# Patient Record
Sex: Male | Born: 1945 | Race: White | Hispanic: No | Marital: Married | Smoking: Never smoker
Health system: Southern US, Community
[De-identification: ages and names within clinical notes are randomized; demographics above are authoritative.]

## PROBLEM LIST (undated history)

## (undated) DIAGNOSIS — K219 Gastro-esophageal reflux disease without esophagitis: Secondary | ICD-10-CM

## (undated) DIAGNOSIS — I10 Essential (primary) hypertension: Secondary | ICD-10-CM

## (undated) DIAGNOSIS — R413 Other amnesia: Secondary | ICD-10-CM

## (undated) DIAGNOSIS — N183 Chronic kidney disease, stage 3 unspecified: Secondary | ICD-10-CM

## (undated) DIAGNOSIS — N529 Male erectile dysfunction, unspecified: Secondary | ICD-10-CM

## (undated) DIAGNOSIS — G4700 Insomnia, unspecified: Secondary | ICD-10-CM

## (undated) HISTORY — PX: UPPER GASTROINTESTINAL ENDOSCOPY: SHX188

## (undated) HISTORY — DX: Other amnesia: R41.3

## (undated) HISTORY — PX: INGUINAL HERNIA REPAIR: SUR1180

## (undated) HISTORY — DX: Essential (primary) hypertension: I10

## (undated) HISTORY — DX: Gastro-esophageal reflux disease without esophagitis: K21.9

## (undated) HISTORY — DX: Male erectile dysfunction, unspecified: N52.9

## (undated) HISTORY — PX: COLONOSCOPY: SHX174

## (undated) HISTORY — DX: Insomnia, unspecified: G47.00

## (undated) HISTORY — DX: Chronic kidney disease, stage 3 unspecified: N18.30

---
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
## 2002-07-14 ENCOUNTER — Ambulatory Visit: Admission: RE | Admit: 2002-07-14 | Discharge: 2002-07-14 | Payer: Self-pay | Admitting: Family Medicine

## 2009-10-13 ENCOUNTER — Encounter: Admission: RE | Admit: 2009-10-13 | Discharge: 2009-10-13 | Payer: Self-pay | Admitting: Internal Medicine

## 2012-06-20 ENCOUNTER — Telehealth: Payer: Self-pay | Admitting: Gastroenterology

## 2012-06-20 NOTE — Telephone Encounter (Signed)
OK for pt to switch, Dr Jarold Motto? Thanks.

## 2012-06-20 NOTE — Telephone Encounter (Signed)
Called pt and he asked that I call him on Monday to schedule his recall Colon.

## 2012-06-20 NOTE — Telephone Encounter (Signed)
Not sure never seen this patient, he can see which ever GI he likes.

## 2012-06-20 NOTE — Telephone Encounter (Signed)
Dr Gessner, will you accept this pt? Thanks. 

## 2012-06-20 NOTE — Telephone Encounter (Signed)
Dr Leone Payor, is a Direct Colon OK? Thanks.

## 2012-06-20 NOTE — Telephone Encounter (Signed)
Yes - as long as not on blood thinners  You should try to get a recent office note from his PCP if possible - looks like it is Dossie Arbour (I think)

## 2012-06-20 NOTE — Telephone Encounter (Signed)
That is fine (he lives around corner from me)

## 2012-06-25 NOTE — Telephone Encounter (Signed)
Left message for patient to call back  

## 2012-06-26 NOTE — Telephone Encounter (Signed)
Unable to reach the patient I have left him a detailed message to call back and schedule his colonoscopy at his convenience.  I have also left a detailed message with [facility] to send Dr. Silvano Rusk last office note

## 2012-06-27 ENCOUNTER — Encounter: Payer: Self-pay | Admitting: Internal Medicine

## 2012-07-25 ENCOUNTER — Ambulatory Visit (AMBULATORY_SURGERY_CENTER): Payer: BC Managed Care – PPO | Admitting: *Deleted

## 2012-07-25 VITALS — Ht 71.0 in | Wt 218.0 lb

## 2012-07-25 DIAGNOSIS — Z1211 Encounter for screening for malignant neoplasm of colon: Secondary | ICD-10-CM

## 2012-07-25 MED ORDER — NA SULFATE-K SULFATE-MG SULF 17.5-3.13-1.6 GM/177ML PO SOLN: ORAL | Status: DC

## 2012-08-08 ENCOUNTER — Encounter: Payer: Self-pay | Admitting: Internal Medicine

## 2012-08-25 ENCOUNTER — Ambulatory Visit (AMBULATORY_SURGERY_CENTER): Payer: BC Managed Care – PPO | Admitting: Internal Medicine

## 2012-08-25 ENCOUNTER — Encounter: Payer: Self-pay | Admitting: *Deleted

## 2012-08-25 ENCOUNTER — Encounter: Payer: Self-pay | Admitting: Internal Medicine

## 2012-08-25 VITALS — BP 128/83 | HR 69 | Temp 97.3°F | Resp 16 | Ht 71.0 in | Wt 218.0 lb

## 2012-08-25 DIAGNOSIS — K648 Other hemorrhoids: Secondary | ICD-10-CM

## 2012-08-25 DIAGNOSIS — Z1211 Encounter for screening for malignant neoplasm of colon: Secondary | ICD-10-CM

## 2012-08-25 DIAGNOSIS — K5730 Diverticulosis of large intestine without perforation or abscess without bleeding: Secondary | ICD-10-CM

## 2012-08-25 MED ORDER — SODIUM CHLORIDE 0.9 % IV SOLN: 500.0000 mL | INTRAVENOUS | Status: DC

## 2012-08-25 NOTE — Op Note (Signed)
[facility] [address] [place], [address]   COLONOSCOPY PROCEDURE REPORT  PATIENT: Alfred Vega, Alfred Vega  MR#: [id number] BIRTHDATE: 07-Dec-1945 , 66  yrs. old GENDER: Male ENDOSCOPIST: Iva Boop, MD, [facility] REFERRED VH:QIONGE Eloise Harman, M.D. PROCEDURE DATE:  08/25/2012 PROCEDURE:   Colonoscopy, diagnostic ASA CLASS:   Class II INDICATIONS:average risk screening. MEDICATIONS: Propofol (Diprivan) 190 mg IV, MAC sedation, administered by CRNA, and These medications were titrated to patient response per physician's verbal order  DESCRIPTION OF PROCEDURE:   After the risks benefits and alternatives of the procedure were thoroughly explained, informed consent was obtained.  A digital rectal exam revealed no abnormalities of the rectum but prostate was smooth and moderately enlarged.   The LB [id number]  endoscope was introduced through the anus and advanced to the cecum, which was identified by both the appendix and ileocecal valve. No adverse events experienced.   The quality of the prep was Suprep excellent  The instrument was then slowly withdrawn as the colon was fully examined.      COLON FINDINGS: Mild diverticulosis was noted in the sigmoid colon. Small internal hemorrhoids were found.   The colon mucosa was otherwise normal inxcluding right colon retroflexion.  Retroflexed views revealed internal hemorrhoids. The time to cecum=2 minutes 51 seconds.  Withdrawal time=8 minutes 05 seconds.  The scope was withdrawn and the procedure completed. COMPLICATIONS: There were no complications.  ENDOSCOPIC IMPRESSION: 1.   Mild diverticulosis was noted in the sigmoid colon 2.   Small internal hemorrhoids 3.   The colon mucosa was otherwise normal - excellent prep  RECOMMENDATIONS: Repeat colonoscopy 10 years.   eSigned:  Iva Boop, MD, [facility] 08/25/2012 12:02 PM   cc: Jarome Matin, MD and The Patient

## 2012-08-25 NOTE — Patient Instructions (Addendum)
There were small internal hemorrhoids and mild diverticulosis of the colon. No polyps! Prep was excellent also.  Next routine colonoscopy 10 years - 2023.  Thank you for choosing me and [facility].  Iva Boop, MD, FACG  YOU HAD AN ENDOSCOPIC PROCEDURE TODAY AT THE [facility]: Refer to the procedure report that was given to you for any specific questions about what was found during the examination.  If the procedure report does not answer your questions, please call your gastroenterologist to clarify.  If you requested that your care partner not be given the details of your procedure findings, then the procedure report has been included in a sealed envelope for you to review at your convenience later.  YOU SHOULD EXPECT: Some feelings of bloating in the abdomen. Passage of more gas than usual.  Walking can help get rid of the air that was put into your GI tract during the procedure and reduce the bloating. If you had a lower endoscopy (such as a colonoscopy or flexible sigmoidoscopy) you may notice spotting of blood in your stool or on the toilet paper. If you underwent a bowel prep for your procedure, then you may not have a normal bowel movement for a few days.  DIET: Your first meal following the procedure should be a light meal and then it is ok to progress to your normal diet.  A half-sandwich or bowl of soup is an example of a good first meal.  Heavy or fried foods are harder to digest and may make you feel nauseous or bloated.  Likewise meals heavy in dairy and vegetables can cause extra gas to form and this can also increase the bloating.  Drink plenty of fluids but you should avoid alcoholic beverages for 24 hours.  ACTIVITY: Your care partner should take you home directly after the procedure.  You should plan to take it easy, moving slowly for the rest of the day.  You can resume normal activity the day after the procedure however you should NOT DRIVE or use  heavy machinery for 24 hours (because of the sedation medicines used during the test).    SYMPTOMS TO REPORT IMMEDIATELY: A gastroenterologist can be reached at any hour.  During normal business hours, 8:30 AM to 5:00 PM Monday through Friday, call [phone].  After hours and on weekends, please call the GI answering service at [phone] who will take a message and have the physician on call contact you.   Following lower endoscopy (colonoscopy or flexible sigmoidoscopy):  Excessive amounts of blood in the stool  Significant tenderness or worsening of abdominal pains  Swelling of the abdomen that is new, acute  Fever of 100F or higher  FOLLOW UP: If any biopsies were taken you will be contacted by phone or by letter within the next 1-3 weeks.  Call your gastroenterologist if you have not heard about the biopsies in 3 weeks.  Our staff will call the home number listed on your records the next business day following your procedure to check on you and address any questions or concerns that you may have at that time regarding the information given to you following your procedure. This is a courtesy call and so if there is no answer at the home number and we have not heard from you through the emergency physician on call, we will assume that you have returned to your regular daily activities without incident.  SIGNATURES/CONFIDENTIALITY: You and/or your care partner have signed  paperwork which will be entered into your electronic medical record.  These signatures attest to the fact that that the information above on your After Visit Summary has been reviewed and is understood.  Full responsibility of the confidentiality of this discharge information lies with you and/or your care-partner.

## 2012-08-25 NOTE — Progress Notes (Signed)
Patient did not experience any of the following events: a burn prior to discharge; a fall within the facility; wrong site/side/patient/procedure/implant event; or a hospital transfer or hospital admission upon discharge from the facility. (G8907) Patient did not have preoperative order for IV antibiotic SSI prophylaxis. (G8918)  

## 2012-08-26 ENCOUNTER — Telehealth: Payer: Self-pay | Admitting: *Deleted

## 2012-08-26 NOTE — Telephone Encounter (Signed)
  Follow up Call-  Call back number 08/25/2012  Post procedure Call Back phone  # [phone]  Permission to leave phone message Yes     Patient questions:  Do you have a fever, pain , or abdominal swelling? no Pain Score  0 *  Have you tolerated food without any problems? yes  Have you been able to return to your normal activities? yes  Do you have any questions about your discharge instructions: Diet   no Medications  no Follow up visit  no  Do you have questions or concerns about your Care? no  Actions: * If pain score is 4 or above: No action needed, pain <4.

## 2015-04-26 ENCOUNTER — Emergency Department (HOSPITAL_COMMUNITY)
Admission: EM | Admit: 2015-04-26 | Discharge: 2015-04-26 | Disposition: A | Discharge status: Home / Self Care | Payer: BLUE CROSS/BLUE SHIELD | Attending: Emergency Medicine | Admitting: Emergency Medicine

## 2015-04-26 ENCOUNTER — Encounter (HOSPITAL_COMMUNITY): Payer: Self-pay | Admitting: Emergency Medicine

## 2015-04-26 DIAGNOSIS — Z7982 Long term (current) use of aspirin: Secondary | ICD-10-CM | POA: Diagnosis not present

## 2015-04-26 DIAGNOSIS — I10 Essential (primary) hypertension: Secondary | ICD-10-CM | POA: Diagnosis not present

## 2015-04-26 DIAGNOSIS — K219 Gastro-esophageal reflux disease without esophagitis: Secondary | ICD-10-CM | POA: Diagnosis not present

## 2015-04-26 DIAGNOSIS — Y998 Other external cause status: Secondary | ICD-10-CM | POA: Insufficient documentation

## 2015-04-26 DIAGNOSIS — S20212A Contusion of left front wall of thorax, initial encounter: Secondary | ICD-10-CM | POA: Diagnosis not present

## 2015-04-26 DIAGNOSIS — M549 Dorsalgia, unspecified: Secondary | ICD-10-CM | POA: Diagnosis not present

## 2015-04-26 DIAGNOSIS — Y92410 Unspecified street and highway as the place of occurrence of the external cause: Secondary | ICD-10-CM | POA: Insufficient documentation

## 2015-04-26 DIAGNOSIS — Y9389 Activity, other specified: Secondary | ICD-10-CM | POA: Diagnosis not present

## 2015-04-26 DIAGNOSIS — Z79899 Other long term (current) drug therapy: Secondary | ICD-10-CM | POA: Diagnosis not present

## 2015-04-26 DIAGNOSIS — S29001A Unspecified injury of muscle and tendon of front wall of thorax, initial encounter: Secondary | ICD-10-CM | POA: Diagnosis present

## 2015-04-26 MED ORDER — CYCLOBENZAPRINE HCL 10 MG PO TABS
5.0000 mg | ORAL_TABLET | Freq: Once | ORAL | Status: AC
  Administered 2015-04-26: 5 mg via ORAL
  Filled 2015-04-26: qty 1

## 2015-04-26 MED ORDER — CYCLOBENZAPRINE HCL 5 MG PO TABS: 5.0000 mg | ORAL_TABLET | Freq: Three times a day (TID) | ORAL | Status: DC | PRN
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
## 2015-04-26 NOTE — Discharge Instructions (Signed)
Chest Contusion A contusion is a deep bruise. Bruises happen when an injury causes bleeding under the skin. Signs of bruising include pain, puffiness (swelling), and discolored skin. The bruise may turn blue, purple, or yellow.  HOME CARE  Put ice on the injured area.  Put ice in a plastic bag.  Place a towel between the skin and the bag.  Leave the ice on for 15-20 minutes at a time, 03-04 times a day for the first 48 hours.  Only take medicine as told by your doctor.  Rest.  Take deep breaths (deep-breathing exercises) as told by your doctor.  Stop smoking if you smoke.  Do not lift objects over 5 pounds (2.3 kilograms) for 3 days or longer if told by your doctor. GET HELP RIGHT AWAY IF:   You have more bruising or puffiness.  You have pain that gets worse.  You have trouble breathing.  You are dizzy, weak, or pass out (faint).  You have blood in your pee (urine) or poop (stool).  You cough up or throw up (vomit) blood.  Your puffiness or pain is not helped with medicines. MAKE SURE YOU:   Understand these instructions.  Will watch your condition.  Will get help right away if you are not doing well or get worse. Document Released: 03/05/2008 Document Revised: 06/11/2012 Document Reviewed: 03/10/2012 [facility] Patient Information 2015 [place], [place]. This information is not intended to replace advice given to you by your health care provider. Make sure you discuss any questions you have with your health care provider.   Mr. Hruska, you were seen here for your back pain, and we have found no evidence of complicated rib fracture or lung injury at this time. We recommend massage and icing, with taking flexeril and vicodin as needed for pain. It may take several days for your symptoms to improve. However, if your symptoms worsen, please seek medical attention. Thanks for letting us take care of you!  Blane Ohara
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
## 2015-08-30 ENCOUNTER — Ambulatory Visit (INDEPENDENT_AMBULATORY_CARE_PROVIDER_SITE_OTHER): Payer: BLUE CROSS/BLUE SHIELD | Admitting: Neurology

## 2015-08-30 ENCOUNTER — Encounter: Payer: Self-pay | Admitting: Neurology

## 2015-08-30 VITALS — BP 138/72 | HR 78 | Resp 20 | Ht 71.0 in | Wt 214.0 lb

## 2015-08-30 DIAGNOSIS — R0902 Hypoxemia: Secondary | ICD-10-CM | POA: Diagnosis not present

## 2015-08-30 NOTE — Progress Notes (Signed)
SLEEP MEDICINE CLINIC   Provider:  Larey Seat, M D  Referring Provider: Leanna Battles, MD Primary Care Physician:  Alfred Lopes, MD  Chief Complaint  Patient presents with  . New Patient (Initial Visit)    pulse ox was performed, concerned about this, rm 11, alone    HPI:  Alfred Vega is a 69 y.o. male , seen here as a referral from Dr. Philip Vega for sleep apnea evaluation,  [name] had been tested by his primary care physician, Dr. Bevelyn Vega, with a overnight pulse oximetry.  This showed a normal variability of pulse data but 25 desaturations per hour of sleep. He spent 30 minutes in desaturation at or below 89% oxygen saturation, the awake oxygen level was 97%. His overnight on room air study at been performed on 07-18-15 and encompassed over 8 hours of data. This would be considered a valid test by recording time. Interesting is that his oxygenation actually dropped in the middle of the night at about 1 AM 3 AM 4 AM but oxygen saturation increased in trend until he woke close to 7 AM. His heartbeat became lower at night and so I do not see symptoms of stress imposed by this mild hypoxemia. His wife has noticed him to snore very rarely infrequently and overall is convinced that her husband "sleeps like a baby ". The patient himself has no symptoms of gasping for air waking this palpitations or diaphoresis, he does not have headaches at night nor in the morning, when he wakes up he feels usually restored and refreshed.  Sleep habits are as follows: The bedroom is cool, quiet and relatively dark. The couple shares the bedroom. Mr. Alfred Vega usually goes to bed at about 10 to 10:30 PM, and falls asleep promptly. He takes a 25 mg Benadryl every night which helps him to sleep. This has been his habit for about 2 years. He usually has only one bathroom break if any at night and Benadryl has helped him to reinitiate sleep after this break.Usually he wakes up between 6.45 and  7 AM spontaneously he does not rely on an alarm. In the morning he feels refreshed and restored he does not feel any hangover effect from Benadryl.  Sleep medical history and family sleep history: No history , his wife has insomnia.   Social history: self employed , used to [employer] , now [employer].  Review of Systems: Out of a complete 14 system review, the patient complains of only the following symptoms, and all other reviewed systems are negative. Memory loss, distractability, insomnia controlled on benadryl.   Epworth score 11 , Fatigue severity score 9  , Phq9-  depression score 15    Social History   Social History  . Marital Status: Married    Spouse Name: N/A  . Number of Children: N/A  . Years of Education: N/A   Occupational History  . Self employed     [employer]   Social History Main Topics  . Smoking status: Never Smoker   . Smokeless tobacco: Never Used  . Alcohol Use: 0.6 oz/week    1 Cans of beer per week     Comment: 2 beers/week  . Drug Use: No  . Sexual Activity: Not on file   Other Topics Concern  . Not on file   Social History Narrative   Drinks 1 cup of coffee every other day.    Family History  Problem Relation Age of Onset  . Colon  cancer Maternal Grandmother 76  . Stomach cancer Neg Hx   . Esophageal cancer Neg Hx   . Rectal cancer Neg Hx   . CAD Mother   . Diabetes Mellitus II Mother   . Heart disease Father   . Heart attack Father   . Lymphoma Father     Past Medical History  Diagnosis Date  . Hypertension   . GERD (gastroesophageal reflux disease)   . ED (erectile dysfunction)   . Insomnia   . Memory loss     Past Surgical History  Procedure Laterality Date  . Appendectomy  1959  . Inguinal hernia repair  1977, 1985    bilateral  . Colonoscopy      Current Outpatient Prescriptions  Medication Sig Dispense Refill  . aspirin 81 MG tablet Take 81 mg by mouth daily.    Marland Kitchen NEXIUM 40 MG capsule Take 40 mg  by mouth daily before breakfast.     . verapamil (CALAN-SR) 120 MG CR tablet      No current facility-administered medications for this visit.    Allergies as of 08/30/2015  . (No Known Allergies)    Vitals: BP 138/72 mmHg  Pulse 78  Resp 20  Ht 5\' 11"  (1.803 m)  Wt 214 lb (97.07 kg)  BMI 29.86 kg/m2 Last Weight:  Wt Readings from Last 1 Encounters:  08/30/15 214 lb (97.07 kg)   [id number] mass index is 29.86 kg/(m^2).     Last Height:   Ht Readings from Last 1 Encounters:  08/30/15 5\' 11"  (1.803 m)    Physical exam:  General: The patient is awake, alert and appears not in acute distress. The patient is well groomed. Head: Normocephalic, atraumatic. Neck is supple. Mallampati 2,  neck circumference: 17. Nasal airflow unrestricited  TMJ not evident . Mild Retrognathia is seen.  Cardiovascular:  Regular rate and rhythm , without  murmurs or carotid bruit, and without distended neck veins. Respiratory: Lungs are clear to auscultation. Skin:  Without evidence of edema, or rash Trunk: BMI is elevated . The patient's posture ; erect   Neurologic exam : The patient is awake and alert, oriented to place and time.   Memory subjective described as intact.  Attention span & concentration ability appears normal.  Speech is fluent,  without dysarthria, dysphonia or aphasia.  Mood and affect are appropriate.  Cranial nerves: Pupils are equal and briskly reactive to light. Extraocular movements  in vertical and horizontal planes intact and without nystagmus. Visual fields by finger perimetry are intact. Hearing to finger rub intact. Facial sensation intact to fine touch. Facial motor strength is symmetric and tongue and uvula move midline. Shoulder shrug was symmetrical.   Motor exam: Normal tone, muscle bulk and symmetric strength in all extremities.  Sensory:  Fine touch, pinprick and vibration were tested in all extremities.  Proprioception tested in the upper extremities was  normal.  Coordination: Rapid alternating movements in the fingers/hands was normal. Finger-to-nose maneuver  normal without evidence of ataxia, dysmetria or tremor.  Gait and station: Patient walks without assistive device and is able unassisted to climb up to the exam table. Strength within normal limits.  Stance is stable and normal.  Tandem gait is unfragmented. Turns with 3 Steps. Romberg testing is negative.  Deep tendon reflexes: in the  upper and lower extremities are symmetric and intact. Babinski maneuver response is  downgoing.  The patient was advised of the nature of the diagnosed sleep disorder , the treatment options  and risks for general a health and wellness arising from not treating the condition.  I spent more than 30 minutes of face to face time with the patient. Greater than 50% of time was spent in counseling and coordination of care. We have discussed the diagnosis and differential and I answered the patient's questions.     Assessment:  After physical and neurologic examination, review of laboratory studies,  Personal review of imaging studies, reports of other /same  Imaging studies ,  Results of polysomnography/ neurophysiology testing and pre-existing records as far as provided in visit., my assessment is   1) my main consideration for this visit today was not to diagnose the patient with ADHD, but by history he has some very likely symptoms that can be only attributed to a residual form of ADD or ADHD. Mr. Weideman is still full time gainfully self-employed he has the ability to make his own schedules and I do not see a need for him to be medicated less he would feel bothered by the symptoms.  2) the patients pulse oximetry showed only 30 minutes which is the cutoff line for hypoxemia being diagnosed in a study that lasted well over 8 hours. Looking at the graphic went out I would think that these aren't correlated with REM sleep phases after midnight and that the hypoxemia  would be physiologically expected. He did not have tachycardia or bradycardia arrhythmias. And actually his heartbeat slowed as the night progressed which is the opposite of a patient that has hypoxemic stress.  3) there are no but witnessed apneas or snoring and the patient has a history of insomnia which has been well-controlled with a small dose of Benadryl in his case 25 mg. Benadryl as an antihistamine it does not interfere with his memory and daytime he does not wake up feeling drowsy or groggy. Based on this I would allow him to continue this regimen should he ever feel a hangover effect I would ask him to take the Benadryl little earlier for that his body has some time to get rid of the metabolites. As to a sleep study in a formal sleep study I think there is barely enough data to justify this.   Plan:  Treatment plan and additional workup :  Revisit when necessary. The patient denies the irresistible urge to sleep, and increased degree of fatigue, and his wife has neither noted snoring nor apneas. He wakes up refreshed and restored.        Asencion Partridge Mayme Profeta MD  08/30/2015   CC: Alfred Vega, [address] [place], [place] [id number]

## 2019-08-13 ENCOUNTER — Other Ambulatory Visit: Payer: Self-pay | Admitting: Internal Medicine

## 2019-08-13 DIAGNOSIS — N183 Chronic kidney disease, stage 3 unspecified: Secondary | ICD-10-CM

## 2019-08-17 ENCOUNTER — Encounter: Payer: Self-pay | Admitting: Physician Assistant

## 2019-08-20 ENCOUNTER — Other Ambulatory Visit: Payer: BLUE CROSS/BLUE SHIELD

## 2019-09-03 ENCOUNTER — Ambulatory Visit
Admission: RE | Admit: 2019-09-03 | Discharge: 2019-09-03 | Disposition: A | Discharge status: Home / Self Care | Payer: Commercial Managed Care - PPO | Source: Ambulatory Visit | Attending: Internal Medicine | Admitting: Internal Medicine

## 2019-09-03 ENCOUNTER — Encounter: Payer: Self-pay | Admitting: Physician Assistant

## 2019-09-03 ENCOUNTER — Ambulatory Visit (INDEPENDENT_AMBULATORY_CARE_PROVIDER_SITE_OTHER): Payer: Commercial Managed Care - PPO | Admitting: Physician Assistant

## 2019-09-03 VITALS — BP 122/70 | HR 73 | Temp 97.6°F | Ht 71.0 in | Wt 217.4 lb

## 2019-09-03 DIAGNOSIS — R195 Other fecal abnormalities: Secondary | ICD-10-CM | POA: Diagnosis not present

## 2019-09-03 DIAGNOSIS — K219 Gastro-esophageal reflux disease without esophagitis: Secondary | ICD-10-CM | POA: Diagnosis not present

## 2019-09-03 DIAGNOSIS — N183 Chronic kidney disease, stage 3 unspecified: Secondary | ICD-10-CM

## 2019-09-03 DIAGNOSIS — Z1159 Encounter for screening for other viral diseases: Secondary | ICD-10-CM | POA: Diagnosis not present

## 2019-09-03 MED ORDER — NA SULFATE-K SULFATE-MG SULF 17.5-3.13-1.6 GM/177ML PO SOLN: 1.0000 | Freq: Once | ORAL | 0 refills | Status: AC

## 2019-09-03 NOTE — Patient Instructions (Signed)
You have been scheduled for an endoscopy and colonoscopy. Please follow the written instructions given to you at your visit today. Please pick up your prep supplies at the pharmacy within the next 1-3 days. If you use inhalers (even only as needed), please bring them with you on the day of your procedure.  

## 2019-09-03 NOTE — Progress Notes (Signed)
Subjective:    Patient ID: Alfred Vega, male    DOB: 12-25-1945, 73 y.o.   MRN: [id number]  HPI Alfred Vega is a pleasant 73 year old white male, known to Dr. Carlean Purl, referred today by Dr. Bevelyn Buckles for Hemoccult positive stool. Patient was last seen in our office in 2013 and underwent colonoscopy at that time which showed mild sigmoid diverticulosis, small internal hemorrhoids and otherwise negative exam. He does have history of hypertension and chronic kidney disease. He is not on any blood thinners. Patient currently has no complaints of melena or hematochezia, no changes in bowel habits, no complaints of abdominal pain or discomfort.  Specifically denies any heartburn or indigestion no dysphagia or odynophagia.  He does have long history of chronic GERD and is on Nexium 40 mg p.o. daily which he says works well.  He believes he has been on prescription medication for reflux for at least 10 years.  He has not had prior EGD.  Recent labs through Dr. Beverly Sessions office reviewed with c-Met unremarkable, WBC 4.3, hemoglobin 15.5, platelets 127.  Review of Systems Pertinent positive and negative review of systems were noted in the above HPI section.  All other review of systems was otherwise negative.  Outpatient Encounter Medications as of 09/03/2019  Medication Sig  . aspirin 81 MG tablet Take 81 mg by mouth daily.  Marland Kitchen NEXIUM 40 MG capsule Take 40 mg by mouth daily before breakfast.   . verapamil (CALAN-SR) 120 MG CR tablet   . Na Sulfate-K Sulfate-Mg Sulf 17.5-3.13-1.6 GM/177ML SOLN Take 1 kit by mouth once for 1 dose.   No facility-administered encounter medications on file as of 09/03/2019.    No Known Allergies Patient Active Problem List   Diagnosis Date Noted  . Hypoxemia 08/30/2015   Social History   Socioeconomic History  . Marital status: Married    Spouse name: Not on file  . Number of children: Not on file  . Years of education: Not on file  . Highest  education level: Not on file  Occupational History  . Occupation: Self employed    Comment: [employer]  Social Needs  . Financial resource strain: Not on file  . Food insecurity    Worry: Not on file    Inability: Not on file  . Transportation needs    Medical: Not on file    Non-medical: Not on file  Tobacco Use  . Smoking status: Never Smoker  . Smokeless tobacco: Never Used  Substance and Sexual Activity  . Alcohol use: Yes    Alcohol/week: 1.0 standard drinks    Types: 1 Cans of beer per week    Comment: 2 beers/week  . Drug use: No  . Sexual activity: Not on file  Lifestyle  . Physical activity    Days per week: Not on file    Minutes per session: Not on file  . Stress: Not on file  Relationships  . Social Herbalist on phone: Not on file    Gets together: Not on file    Attends religious service: Not on file    Active member of club or organization: Not on file    Attends meetings of clubs or organizations: Not on file    Relationship status: Not on file  . Intimate partner violence    Fear of current or ex partner: Not on file    Emotionally abused: Not on file    Physically abused: Not on file  Forced sexual activity: Not on file  Other Topics Concern  . Not on file  Social History Narrative   Drinks 1 cup of coffee every other day.    Alfred Vega family history includes CAD in his mother; Colon cancer (age of onset: 23) in his maternal grandmother; Diabetes Mellitus II in his mother; Heart attack in his father; Heart disease in his father; Lymphoma in his father.      Objective:    Vitals:   09/03/19 0910  BP: 122/70  Pulse: 73  Temp: 97.6 F (36.4 C)    Physical Exam Well-developed well-nourished older white male in no acute distress.  Height, Weight, 217 BMI 30.3  HEENT; nontraumatic normocephalic, EOMI, PER R LA, sclera anicteric. Oropharynx; not examined/mask/Covid Neck; supple, no JVD Cardiovascular; regular rate and rhythm  with S1-S2, no murmur rub or gallop Pulmonary; Clear bilaterally Abdomen; soft, nontender, nondistended, no palpable mass or hepatosplenomegaly, bowel sounds are active Rectal; not done-recently documented Hemoccult positive Skin; benign exam, no jaundice rash or appreciable lesions Extremities; no clubbing cyanosis or edema skin warm and dry Neuro/Psych; alert and oriented x4, grossly nonfocal mood and affect appropriate       Assessment & Plan:   #28 73 year old white male with heme positive stool, asymptomatic, with normal hemoglobin. Rule out occult colon lesion. #2 chronic GERD, requiring prescription therapy for several years.  Rule out Barrett's esophagus #3 diverticulosis 4.  Previously documented internal hemorrhoids 5.  Hypertension 6.  Chronic kidney disease  Plan; patient will be scheduled for Colonoscopy and upper endoscopy with Dr. Carlean Purl.  Both procedures were discussed in detail with the patient including indications risks and benefits and he is agreeable to proceed. Continue Nexium 40 mg p.o. every morning Further recommendations pending findings at above.  Kailiana Granquist Genia Harold PA-C 09/03/2019   Cc: Leanna Battles, MD

## 2019-09-17 ENCOUNTER — Encounter: Payer: Self-pay | Admitting: Internal Medicine

## 2019-09-18 ENCOUNTER — Other Ambulatory Visit: Payer: Self-pay | Admitting: Internal Medicine

## 2019-09-18 ENCOUNTER — Ambulatory Visit (INDEPENDENT_AMBULATORY_CARE_PROVIDER_SITE_OTHER): Payer: Commercial Managed Care - PPO

## 2019-09-18 DIAGNOSIS — Z1159 Encounter for screening for other viral diseases: Secondary | ICD-10-CM

## 2019-09-18 LAB — SARS CORONAVIRUS 2 (TAT 6-24 HRS): SARS Coronavirus 2: NEGATIVE

## 2019-09-21 ENCOUNTER — Encounter: Payer: Self-pay | Admitting: Internal Medicine

## 2019-09-21 ENCOUNTER — Ambulatory Visit (AMBULATORY_SURGERY_CENTER): Payer: Commercial Managed Care - PPO | Admitting: Internal Medicine

## 2019-09-21 ENCOUNTER — Other Ambulatory Visit: Payer: Self-pay

## 2019-09-21 VITALS — BP 137/81 | HR 67 | Temp 97.9°F | Resp 14 | Ht 71.0 in | Wt 217.0 lb

## 2019-09-21 DIAGNOSIS — D122 Benign neoplasm of ascending colon: Secondary | ICD-10-CM

## 2019-09-21 DIAGNOSIS — K449 Diaphragmatic hernia without obstruction or gangrene: Secondary | ICD-10-CM

## 2019-09-21 DIAGNOSIS — D125 Benign neoplasm of sigmoid colon: Secondary | ICD-10-CM

## 2019-09-21 DIAGNOSIS — K5730 Diverticulosis of large intestine without perforation or abscess without bleeding: Secondary | ICD-10-CM

## 2019-09-21 DIAGNOSIS — K317 Polyp of stomach and duodenum: Secondary | ICD-10-CM

## 2019-09-21 DIAGNOSIS — K219 Gastro-esophageal reflux disease without esophagitis: Secondary | ICD-10-CM | POA: Diagnosis not present

## 2019-09-21 DIAGNOSIS — R195 Other fecal abnormalities: Secondary | ICD-10-CM

## 2019-09-21 MED ORDER — SODIUM CHLORIDE 0.9 % IV SOLN
500.0000 mL | Freq: Once | INTRAVENOUS | Status: DC
Start: 2019-09-21 — End: 2019-09-21

## 2019-09-21 NOTE — Progress Notes (Signed)
A/ox3, pleased with MAC, report to RN 

## 2019-09-21 NOTE — Patient Instructions (Addendum)
I found and removed 5 colon polyps - all tiny and benign-appearing.  You also have some stomach polyps that look benign and innocent and are likely related to taking nexium.  Once I see the pathology results I will explain more but I do not think anything bad going on.  I appreciate the opportunity to care for you. Gatha Mayer, MD, [facility]  Handouts given on polyps and diverticulosis.  YOU HAD AN ENDOSCOPIC PROCEDURE TODAY AT [facility]:   Refer to the procedure report that was given to you for any specific questions about what was found during the examination.  If the procedure report does not answer your questions, please call your gastroenterologist to clarify.  If you requested that your care partner not be given the details of your procedure findings, then the procedure report has been included in a sealed envelope for you to review at your convenience later.  YOU SHOULD EXPECT: Some feelings of bloating in the abdomen. Passage of more gas than usual.  Walking can help get rid of the air that was put into your GI tract during the procedure and reduce the bloating. If you had a lower endoscopy (such as a colonoscopy or flexible sigmoidoscopy) you may notice spotting of blood in your stool or on the toilet paper. If you underwent a bowel prep for your procedure, you may not have a normal bowel movement for a few days.  Please Note:  You might notice some irritation and congestion in your nose or some drainage.  This is from the oxygen used during your procedure.  There is no need for concern and it should clear up in a day or so.  SYMPTOMS TO REPORT IMMEDIATELY:   Following lower endoscopy (colonoscopy or flexible sigmoidoscopy):  Excessive amounts of blood in the stool  Significant tenderness or worsening of abdominal pains  Swelling of the abdomen that is new, acute  Fever of 100F or higher   Following upper endoscopy (EGD)  Vomiting of blood or coffee ground  material  New chest pain or pain under the shoulder blades  Painful or persistently difficult swallowing  New shortness of breath  Fever of 100F or higher  Black, tarry-looking stools  For urgent or emergent issues, a gastroenterologist can be reached at any hour by calling [phone].   DIET:  We do recommend a small meal at first, but then you may proceed to your regular diet.  Drink plenty of fluids but you should avoid alcoholic beverages for 24 hours.  ACTIVITY:  You should plan to take it easy for the rest of today and you should NOT DRIVE or use heavy machinery until tomorrow (because of the sedation medicines used during the test).    FOLLOW UP: Our staff will call the number listed on your records 48-72 hours following your procedure to check on you and address any questions or concerns that you may have regarding the information given to you following your procedure. If we do not reach you, we will leave a message.  We will attempt to reach you two times.  During this call, we will ask if you have developed any symptoms of COVID 19. If you develop any symptoms (ie: fever, flu-like symptoms, shortness of breath, cough etc.) before then, please call [phone].  If you test positive for Covid 19 in the 2 weeks post procedure, please call and report this information to [place].    If any biopsies were taken you will  be contacted by phone or by letter within the next 1-3 weeks.  Please call us at [phone] if you have not heard about the biopsies in 3 weeks.    SIGNATURES/CONFIDENTIALITY: You and/or your care partner have signed paperwork which will be entered into your electronic medical record.  These signatures attest to the fact that that the information above on your After Visit Summary has been reviewed and is understood.  Full responsibility of the confidentiality of this discharge information lies with you and/or your care-partner.

## 2019-09-21 NOTE — Op Note (Signed)
[facility] Patient Name: Alfred Vega Procedure Date: 09/21/2019 3:54 PM MRN: [id number] Endoscopist: Gatha Mayer , MD Age: 73 Referring MD:  Date of Birth: 26-Dec-1945 Gender: Male Account #: [id number] Procedure:                Upper GI endoscopy Indications:              Esophageal reflux, Exclusion of Barrett's esophagus Medicines:                Propofol per Anesthesia, Monitored Anesthesia Care Procedure:                Pre-Anesthesia Assessment:                           - Prior to the procedure, a History and Physical                            was performed, and patient medications and                            allergies were reviewed. The patient's tolerance of                            previous anesthesia was also reviewed. The risks                            and benefits of the procedure and the sedation                            options and risks were discussed with the patient.                            All questions were answered, and informed consent                            was obtained. Prior Anticoagulants: The patient has                            taken no previous anticoagulant or antiplatelet                            agents. ASA Grade Assessment: II - A patient with                            mild systemic disease. After reviewing the risks                            and benefits, the patient was deemed in                            satisfactory condition to undergo the procedure.                           After obtaining informed consent, the endoscope was  passed under direct vision. Throughout the                            procedure, the patient's blood pressure, pulse, and                            oxygen saturations were monitored continuously. The                            Endoscope was introduced through the mouth, and                            advanced to the second part of duodenum. The upper                     GI endoscopy was accomplished without difficulty.                            The patient tolerated the procedure well. Scope In: Scope Out: Findings:                 The examined esophagus was normal.                           Multiple diminutive semi-sessile polyps were found                            in the gastric fundus and in the gastric body.                            Biopsies were taken with a cold forceps for                            histology. Verification of patient identification                            for the specimen was done. Estimated blood loss was                            minimal.                           Patchy mildly erythematous mucosa was found in the                            entire examined stomach.                           The cardia and gastric fundus were normal on                            retroflexion.                           A small hiatal hernia was present.  The examined duodenum was normal. Complications:            No immediate complications. Estimated Blood Loss:     Estimated blood loss was minimal. Impression:               - Normal esophagus.                           - Multiple gastric polyps. Biopsied.                           - Erythematous mucosa in the stomach.                           - Small hiatal hernia.                           - Normal examined duodenum. Recommendation:           - Patient has a contact number available for                            emergencies. The signs and symptoms of potential                            delayed complications were discussed with the                            patient. Return to normal activities tomorrow.                            Written discharge instructions were provided to the                            patient.                           - Resume previous diet.                           - Continue present medications.                            - Await pathology results.                           - See the other procedure note for documentation of                            additional recommendations. Gatha Mayer, MD 09/21/2019 4:43:10 PM This report has been signed electronically.

## 2019-09-21 NOTE — Progress Notes (Signed)
Called to room to assist during endoscopic procedure.  Patient ID and intended procedure confirmed with present staff. Received instructions for my participation in the procedure from the performing physician.  

## 2019-09-21 NOTE — Progress Notes (Signed)
Temp LC  VS DT  Pt's states no medical or surgical changes since previsit or office visit.  Admitting RN reviewed. 

## 2019-09-21 NOTE — Op Note (Signed)
[facility] Patient Name: Alfred Vega Procedure Date: 09/21/2019 3:53 PM MRN: [id number] Endoscopist: Gatha Mayer , MD Age: 73 Referring MD:  Date of Birth: 06/18/1946 Gender: Male Account #: [id number] Procedure:                Colonoscopy Indications:              Positive fecal immunochemical test Medicines:                Propofol per Anesthesia, Monitored Anesthesia Care Procedure:                Pre-Anesthesia Assessment:                           - Prior to the procedure, a History and Physical                            was performed, and patient medications and                            allergies were reviewed. The patient's tolerance of                            previous anesthesia was also reviewed. The risks                            and benefits of the procedure and the sedation                            options and risks were discussed with the patient.                            All questions were answered, and informed consent                            was obtained. Prior Anticoagulants: The patient has                            taken no previous anticoagulant or antiplatelet                            agents. ASA Grade Assessment: II - A patient with                            mild systemic disease. After reviewing the risks                            and benefits, the patient was deemed in                            satisfactory condition to undergo the procedure.                           After obtaining informed consent, the colonoscope  was passed under direct vision. Throughout the                            procedure, the patient's blood pressure, pulse, and                            oxygen saturations were monitored continuously. The                            Colonoscope was introduced through the anus and                            advanced to the the cecum, identified by                            appendiceal  orifice and ileocecal valve. The                            colonoscopy was performed without difficulty. The                            patient tolerated the procedure well. The quality                            of the bowel preparation was good. The bowel                            preparation used was Miralax via split dose                            instruction. The ileocecal valve, appendiceal                            orifice, and rectum were photographed. Scope In: 4:10:21 PM Scope Out: 4:31:35 PM Scope Withdrawal Time: 0 hours 19 minutes 24 seconds  Total Procedure Duration: 0 hours 21 minutes 14 seconds  Findings:                 The perianal and digital rectal examinations were                            normal. Pertinent negatives include normal prostate                            (size, shape, and consistency).                           Three sessile polyps were found in the sigmoid                            colon and ascending colon. The polyps were                            diminutive in size. These polyps were removed with  a cold snare. Resection and retrieval were                            complete. Verification of patient identification                            for the specimen was done. Estimated blood loss was                            minimal.                           Two sessile polyps were found in the ascending                            colon. The polyps were diminutive in size. These                            polyps were removed with a cold biopsy forceps.                            Resection and retrieval were complete. Verification                            of patient identification for the specimen was                            done. Estimated blood loss was minimal.                           Multiple diverticula were found in the sigmoid                            colon.                           The exam was otherwise  without abnormality on                            direct and retroflexion views. Complications:            No immediate complications. Estimated Blood Loss:     Estimated blood loss was minimal. Impression:               - Three diminutive polyps in the sigmoid colon and                            in the ascending colon, removed with a cold snare.                            Resected and retrieved.                           - Two diminutive polyps in the ascending colon,  removed with a cold biopsy forceps. Resected and                            retrieved.                           - Diverticulosis in the sigmoid colon.                           - The examination was otherwise normal on direct                            and retroflexion views. Recommendation:           - Patient has a contact number available for                            emergencies. The signs and symptoms of potential                            delayed complications were discussed with the                            patient. Return to normal activities tomorrow.                            Written discharge instructions were provided to the                            patient.                           - Resume previous diet.                           - Continue present medications.                           - Repeat colonoscopy is recommended. The                            colonoscopy date will be determined after pathology                            results from today's exam become available for                            review. Gatha Mayer, MD 09/21/2019 4:47:01 PM This report has been signed electronically.

## 2019-09-23 ENCOUNTER — Telehealth: Payer: Self-pay

## 2019-09-23 NOTE — Telephone Encounter (Signed)
  Follow up Call-  Call back number 09/21/2019  Post procedure Call Back phone  # [phone]  Permission to leave phone message Yes  Some recent data might be hidden     Patient questions:  Do you have a fever, pain , or abdominal swelling? No. Pain Score  0 *  Have you tolerated food without any problems? Yes.    Have you been able to return to your normal activities? Yes.    Do you have any questions about your discharge instructions: Diet   No. Medications  No. Follow up visit  No.  Do you have questions or concerns about your Care? No.  Actions: * If pain score is 4 or above: No action needed, pain <4.  1. Have you developed a fever since your procedure? no  2.   Have you had an respiratory symptoms (SOB or cough) since your procedure? no  3.   Have you tested positive for COVID 19 since your procedure no  4.   Have you had any family members/close contacts diagnosed with the COVID 19 since your procedure?  no   If yes to any of these questions please route to Joylene John, RN and Alphonsa Gin, Therapist, sports.

## 2019-10-01 ENCOUNTER — Encounter: Payer: Self-pay | Admitting: Internal Medicine

## 2019-10-01 DIAGNOSIS — K317 Polyp of stomach and duodenum: Secondary | ICD-10-CM | POA: Insufficient documentation

## 2019-10-01 DIAGNOSIS — Z860100 Personal history of colon polyps, unspecified: Secondary | ICD-10-CM | POA: Insufficient documentation

## 2019-10-01 HISTORY — DX: Personal history of colon polyps, unspecified: Z86.0100

## 2019-10-01 HISTORY — DX: Personal history of colonic polyps: Z86.010

## 2019-10-01 HISTORY — DX: Polyp of stomach and duodenum: K31.7

## 2019-10-06 ENCOUNTER — Other Ambulatory Visit: Payer: Self-pay | Admitting: *Deleted

## 2019-10-31 ENCOUNTER — Ambulatory Visit: Payer: Commercial Managed Care - PPO

## 2019-11-05 ENCOUNTER — Ambulatory Visit: Payer: Commercial Managed Care - PPO

## 2019-11-08 ENCOUNTER — Ambulatory Visit: Payer: Commercial Managed Care - PPO

## 2019-11-11 ENCOUNTER — Ambulatory Visit: Payer: Commercial Managed Care - PPO

## 2020-06-01 DIAGNOSIS — H903 Sensorineural hearing loss, bilateral: Secondary | ICD-10-CM | POA: Diagnosis not present

## 2020-06-01 DIAGNOSIS — H9313 Tinnitus, bilateral: Secondary | ICD-10-CM | POA: Diagnosis not present

## 2020-08-02 DIAGNOSIS — X32XXXD Exposure to sunlight, subsequent encounter: Secondary | ICD-10-CM | POA: Diagnosis not present

## 2020-08-02 DIAGNOSIS — L720 Epidermal cyst: Secondary | ICD-10-CM | POA: Diagnosis not present

## 2020-08-02 DIAGNOSIS — L570 Actinic keratosis: Secondary | ICD-10-CM | POA: Diagnosis not present

## 2020-08-05 DIAGNOSIS — I10 Essential (primary) hypertension: Secondary | ICD-10-CM | POA: Diagnosis not present

## 2020-08-05 DIAGNOSIS — Z125 Encounter for screening for malignant neoplasm of prostate: Secondary | ICD-10-CM | POA: Diagnosis not present

## 2020-08-12 DIAGNOSIS — I129 Hypertensive chronic kidney disease with stage 1 through stage 4 chronic kidney disease, or unspecified chronic kidney disease: Secondary | ICD-10-CM | POA: Diagnosis not present

## 2020-08-12 DIAGNOSIS — Z0000 Encounter for general adult medical examination without abnormal findings: Secondary | ICD-10-CM | POA: Diagnosis not present

## 2020-08-12 DIAGNOSIS — R490 Dysphonia: Secondary | ICD-10-CM | POA: Diagnosis not present

## 2020-08-12 DIAGNOSIS — M25561 Pain in right knee: Secondary | ICD-10-CM | POA: Diagnosis not present

## 2020-08-12 DIAGNOSIS — G4700 Insomnia, unspecified: Secondary | ICD-10-CM | POA: Diagnosis not present

## 2020-08-12 DIAGNOSIS — K219 Gastro-esophageal reflux disease without esophagitis: Secondary | ICD-10-CM | POA: Diagnosis not present

## 2020-08-12 DIAGNOSIS — I10 Essential (primary) hypertension: Secondary | ICD-10-CM | POA: Diagnosis not present

## 2020-08-12 DIAGNOSIS — R6889 Other general symptoms and signs: Secondary | ICD-10-CM | POA: Diagnosis not present

## 2020-08-12 DIAGNOSIS — H9190 Unspecified hearing loss, unspecified ear: Secondary | ICD-10-CM | POA: Diagnosis not present

## 2020-08-29 DIAGNOSIS — L570 Actinic keratosis: Secondary | ICD-10-CM | POA: Diagnosis not present

## 2020-08-29 DIAGNOSIS — L720 Epidermal cyst: Secondary | ICD-10-CM | POA: Diagnosis not present

## 2020-08-29 DIAGNOSIS — X32XXXD Exposure to sunlight, subsequent encounter: Secondary | ICD-10-CM | POA: Diagnosis not present

## 2020-09-07 DIAGNOSIS — Z1212 Encounter for screening for malignant neoplasm of rectum: Secondary | ICD-10-CM | POA: Diagnosis not present

## 2020-09-15 DIAGNOSIS — H0102B Squamous blepharitis left eye, upper and lower eyelids: Secondary | ICD-10-CM | POA: Diagnosis not present

## 2020-09-15 DIAGNOSIS — H0102A Squamous blepharitis right eye, upper and lower eyelids: Secondary | ICD-10-CM | POA: Diagnosis not present

## 2020-09-15 DIAGNOSIS — H40013 Open angle with borderline findings, low risk, bilateral: Secondary | ICD-10-CM | POA: Diagnosis not present

## 2020-09-15 DIAGNOSIS — H2513 Age-related nuclear cataract, bilateral: Secondary | ICD-10-CM | POA: Diagnosis not present

## 2020-09-15 DIAGNOSIS — H31092 Other chorioretinal scars, left eye: Secondary | ICD-10-CM | POA: Diagnosis not present

## 2020-09-15 DIAGNOSIS — H10413 Chronic giant papillary conjunctivitis, bilateral: Secondary | ICD-10-CM | POA: Diagnosis not present

## 2020-10-10 DIAGNOSIS — K219 Gastro-esophageal reflux disease without esophagitis: Secondary | ICD-10-CM | POA: Diagnosis not present

## 2020-10-14 DIAGNOSIS — I10 Essential (primary) hypertension: Secondary | ICD-10-CM | POA: Diagnosis not present

## 2021-02-07 DIAGNOSIS — L570 Actinic keratosis: Secondary | ICD-10-CM | POA: Diagnosis not present

## 2021-02-07 DIAGNOSIS — X32XXXD Exposure to sunlight, subsequent encounter: Secondary | ICD-10-CM | POA: Diagnosis not present

## 2021-02-07 DIAGNOSIS — L820 Inflamed seborrheic keratosis: Secondary | ICD-10-CM | POA: Diagnosis not present

## 2021-02-28 ENCOUNTER — Other Ambulatory Visit: Payer: Commercial Managed Care - PPO

## 2021-03-15 DIAGNOSIS — C44629 Squamous cell carcinoma of skin of left upper limb, including shoulder: Secondary | ICD-10-CM | POA: Diagnosis not present

## 2021-07-29 DIAGNOSIS — Z23 Encounter for immunization: Secondary | ICD-10-CM | POA: Diagnosis not present

## 2021-08-08 DIAGNOSIS — Z85828 Personal history of other malignant neoplasm of skin: Secondary | ICD-10-CM | POA: Diagnosis not present

## 2021-08-08 DIAGNOSIS — Z08 Encounter for follow-up examination after completed treatment for malignant neoplasm: Secondary | ICD-10-CM | POA: Diagnosis not present

## 2021-08-08 DIAGNOSIS — X32XXXD Exposure to sunlight, subsequent encounter: Secondary | ICD-10-CM | POA: Diagnosis not present

## 2021-08-08 DIAGNOSIS — L570 Actinic keratosis: Secondary | ICD-10-CM | POA: Diagnosis not present

## 2021-09-04 DIAGNOSIS — I10 Essential (primary) hypertension: Secondary | ICD-10-CM | POA: Diagnosis not present

## 2021-09-04 DIAGNOSIS — Z125 Encounter for screening for malignant neoplasm of prostate: Secondary | ICD-10-CM | POA: Diagnosis not present

## 2021-09-11 DIAGNOSIS — R490 Dysphonia: Secondary | ICD-10-CM | POA: Diagnosis not present

## 2021-09-11 DIAGNOSIS — Z0000 Encounter for general adult medical examination without abnormal findings: Secondary | ICD-10-CM | POA: Diagnosis not present

## 2021-09-11 DIAGNOSIS — I10 Essential (primary) hypertension: Secondary | ICD-10-CM | POA: Diagnosis not present

## 2021-09-11 DIAGNOSIS — Z1212 Encounter for screening for malignant neoplasm of rectum: Secondary | ICD-10-CM | POA: Diagnosis not present

## 2021-09-11 DIAGNOSIS — R82998 Other abnormal findings in urine: Secondary | ICD-10-CM | POA: Diagnosis not present

## 2021-09-11 DIAGNOSIS — F988 Other specified behavioral and emotional disorders with onset usually occurring in childhood and adolescence: Secondary | ICD-10-CM | POA: Diagnosis not present

## 2021-09-11 DIAGNOSIS — N529 Male erectile dysfunction, unspecified: Secondary | ICD-10-CM | POA: Diagnosis not present

## 2021-09-11 DIAGNOSIS — R972 Elevated prostate specific antigen [PSA]: Secondary | ICD-10-CM | POA: Diagnosis not present

## 2021-09-18 IMAGING — US US RENAL
1 series · 14 of 25 positions shown · non-contrast
Comparison: Abdominal MRI dated 10/13/2009.

CLINICAL DATA: 73-year-old male with chronic kidney disease.
Evaluate for obstruction.

EXAM:
RENAL / URINARY TRACT ULTRASOUND COMPLETE

[Series 1: us renal · 0.25mm/px · 14 of 52 slices shown]
[im 1/52]
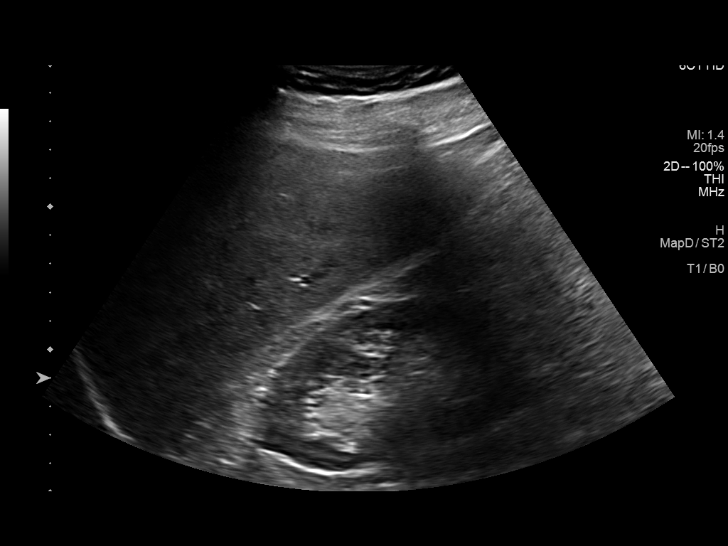
[im 5/52]
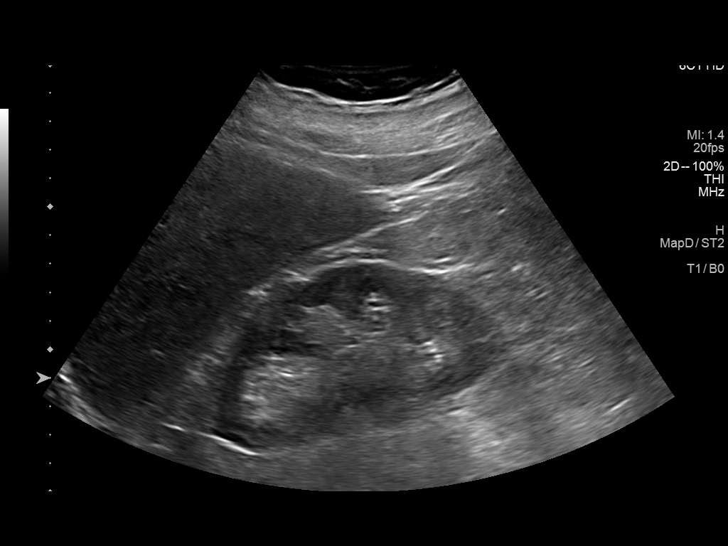
[im 9/52]
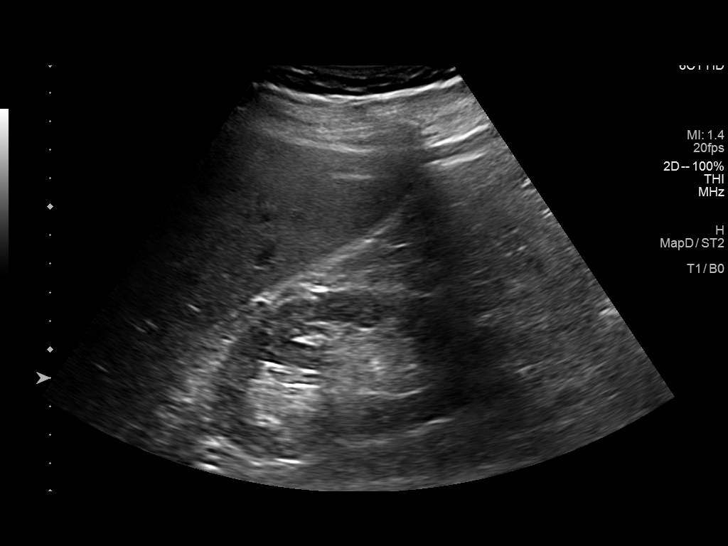
[im 13/52]
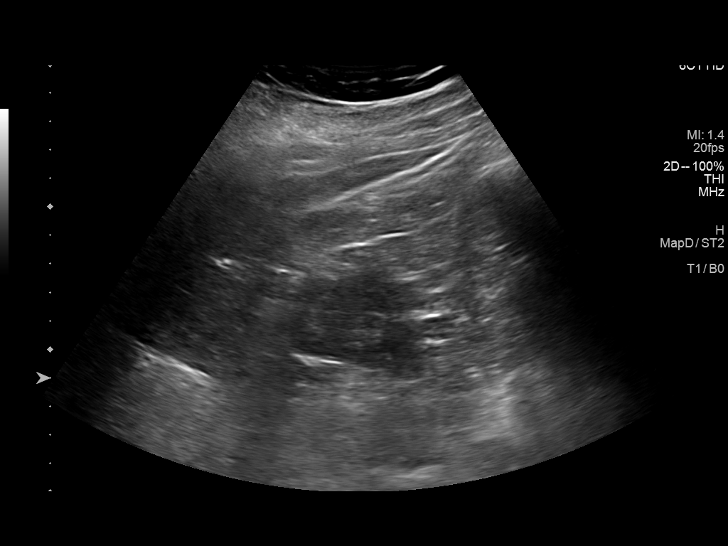
[im 18/52]
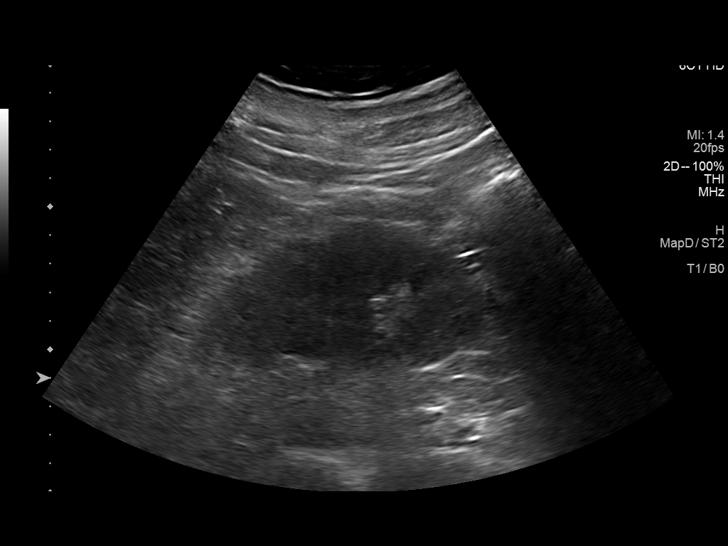
[im 20/52]
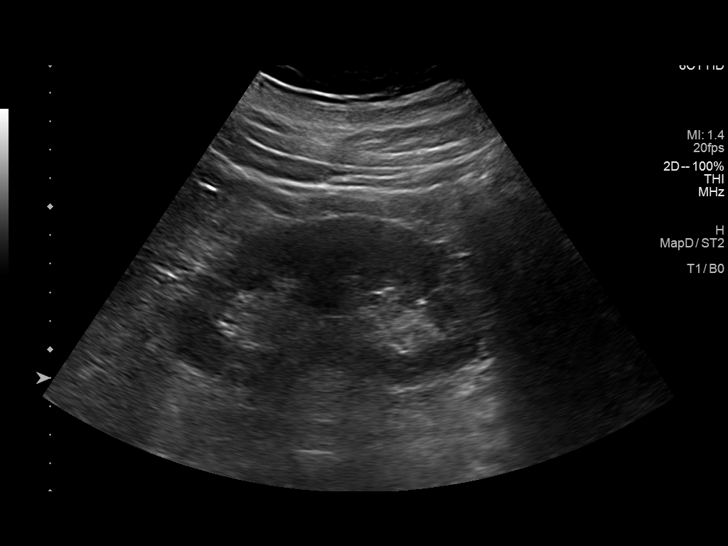
[im 24/52]
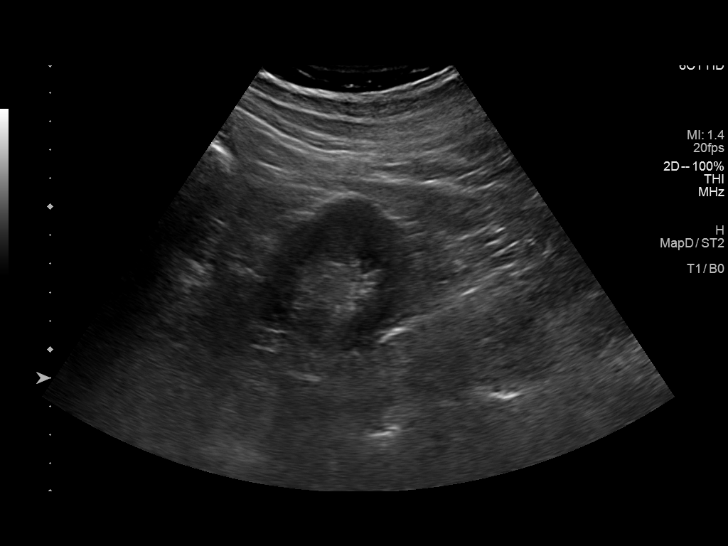
[im 28/52]
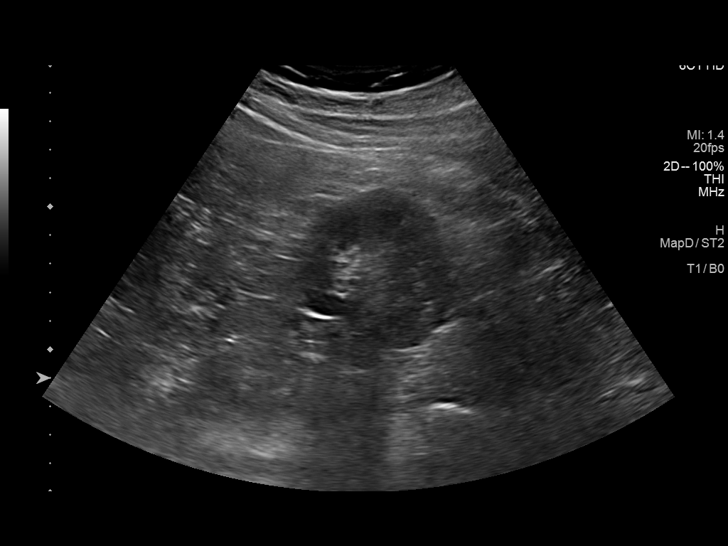
[im 32/52]
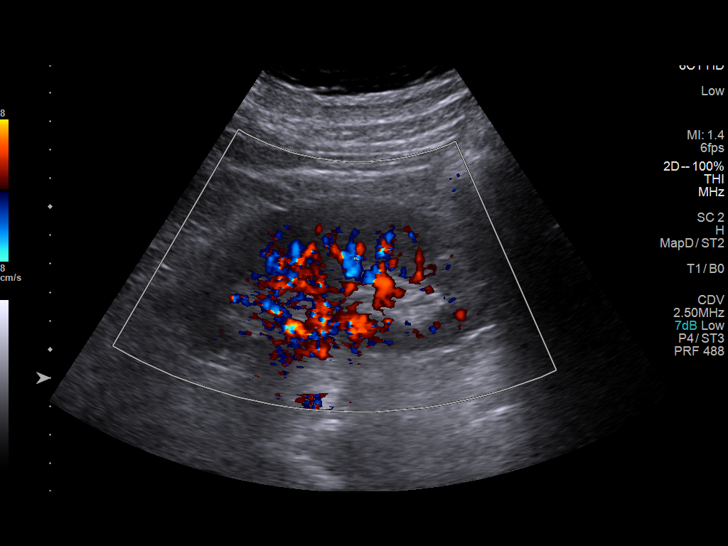
[im 35/52]
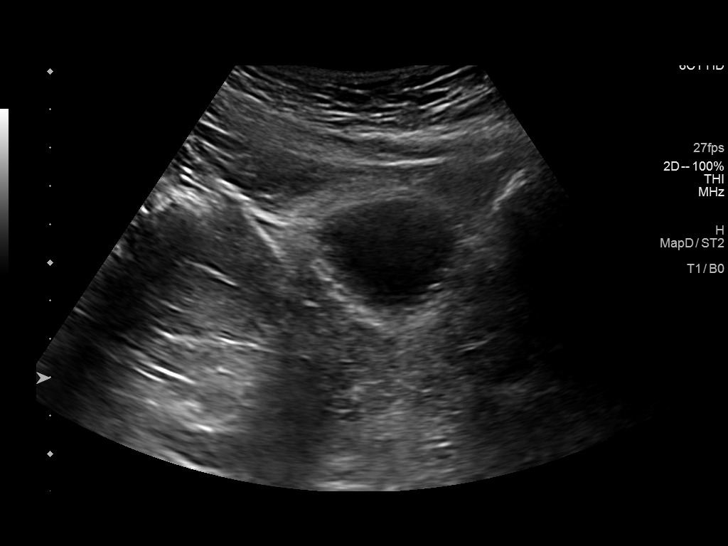
[im 39/52]
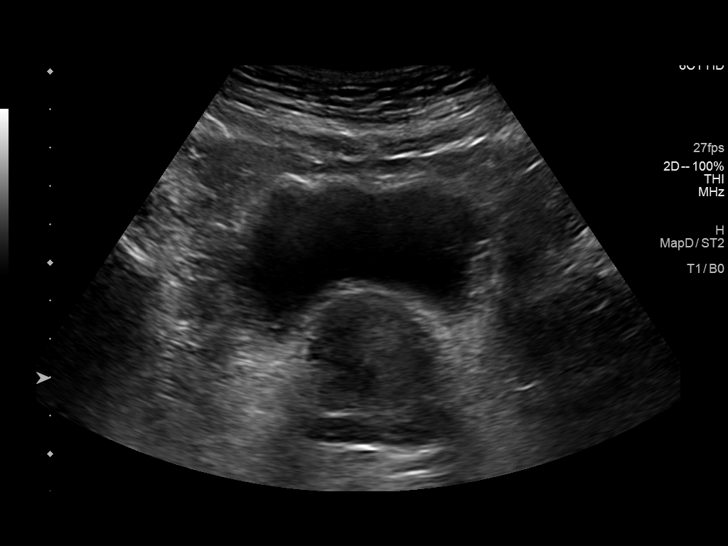
[im 43/52]
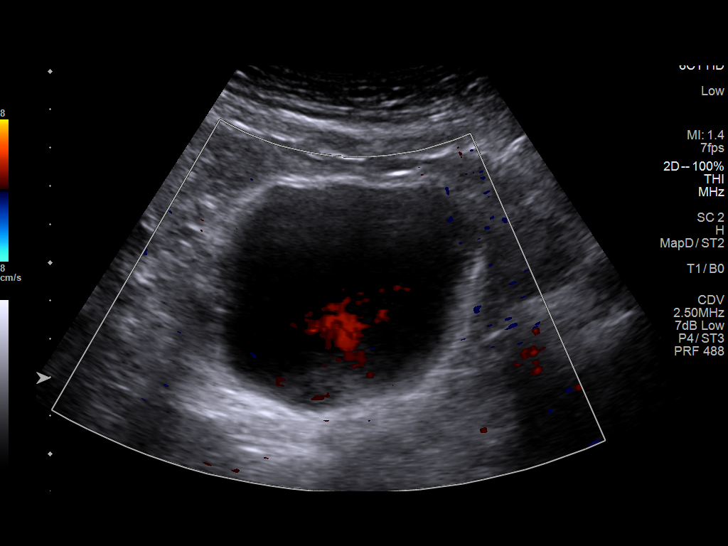
[im 47/52]
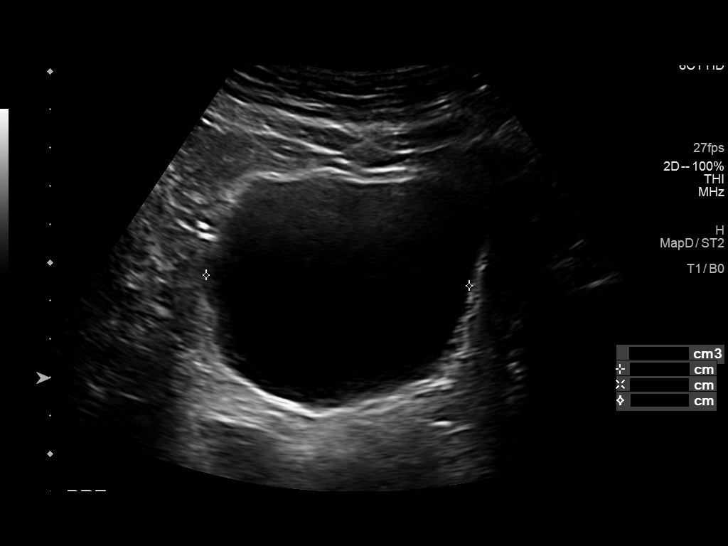
[im 52/52]
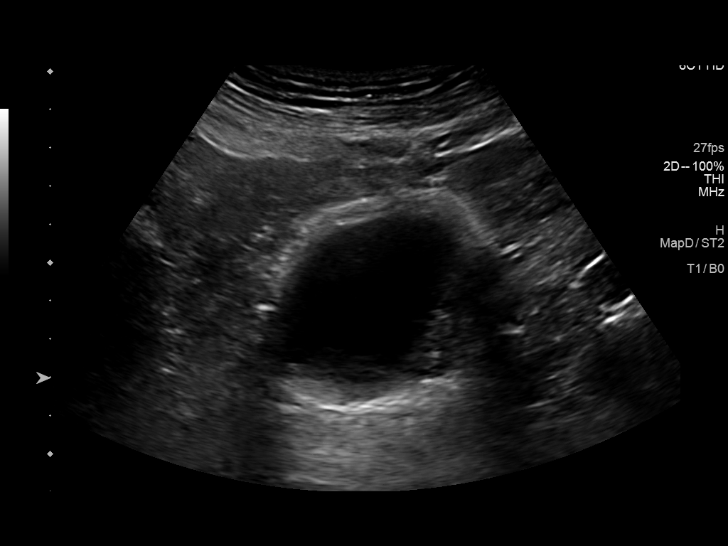

[14 of 25 positions shown; findings below may reference images not displayed]

FINDINGS: Right Kidney:

Renal measurements: 10.8 x 5.9 x 5.7 cm = volume: 192 mL. Normal
echogenicity. No hydronephrosis or shadowing stone.

Left Kidney:

Renal measurements: 11.0 x 5.5 x 5.4 cm = volume: 170 mL. Normal
echogenicity. No hydronephrosis or shadowing stone.

Bladder:

Appears normal for degree of bladder distention. There is a postvoid
residual volume of 31 cc.

Other:

The prostate gland measures 4.2 cm in transverse diameter.
IMPRESSION: No hydronephrosis or shadowing stone.

## 2021-09-19 DIAGNOSIS — H0102B Squamous blepharitis left eye, upper and lower eyelids: Secondary | ICD-10-CM | POA: Diagnosis not present

## 2021-09-19 DIAGNOSIS — H2513 Age-related nuclear cataract, bilateral: Secondary | ICD-10-CM | POA: Diagnosis not present

## 2021-09-19 DIAGNOSIS — H31092 Other chorioretinal scars, left eye: Secondary | ICD-10-CM | POA: Diagnosis not present

## 2021-09-19 DIAGNOSIS — H0102A Squamous blepharitis right eye, upper and lower eyelids: Secondary | ICD-10-CM | POA: Diagnosis not present

## 2021-09-19 DIAGNOSIS — H40013 Open angle with borderline findings, low risk, bilateral: Secondary | ICD-10-CM | POA: Diagnosis not present

## 2021-09-19 DIAGNOSIS — H10413 Chronic giant papillary conjunctivitis, bilateral: Secondary | ICD-10-CM | POA: Diagnosis not present

## 2021-10-03 DIAGNOSIS — R972 Elevated prostate specific antigen [PSA]: Secondary | ICD-10-CM | POA: Diagnosis not present

## 2022-02-06 DIAGNOSIS — X32XXXD Exposure to sunlight, subsequent encounter: Secondary | ICD-10-CM | POA: Diagnosis not present

## 2022-02-06 DIAGNOSIS — D225 Melanocytic nevi of trunk: Secondary | ICD-10-CM | POA: Diagnosis not present

## 2022-02-06 DIAGNOSIS — L570 Actinic keratosis: Secondary | ICD-10-CM | POA: Diagnosis not present

## 2022-02-06 DIAGNOSIS — Z1283 Encounter for screening for malignant neoplasm of skin: Secondary | ICD-10-CM | POA: Diagnosis not present

## 2022-09-26 ENCOUNTER — Encounter: Payer: Self-pay | Admitting: Internal Medicine

## 2022-10-03 DIAGNOSIS — H0102B Squamous blepharitis left eye, upper and lower eyelids: Secondary | ICD-10-CM | POA: Diagnosis not present

## 2022-10-03 DIAGNOSIS — H0102A Squamous blepharitis right eye, upper and lower eyelids: Secondary | ICD-10-CM | POA: Diagnosis not present

## 2022-10-03 DIAGNOSIS — H10413 Chronic giant papillary conjunctivitis, bilateral: Secondary | ICD-10-CM | POA: Diagnosis not present

## 2022-10-03 DIAGNOSIS — H31092 Other chorioretinal scars, left eye: Secondary | ICD-10-CM | POA: Diagnosis not present

## 2022-10-03 DIAGNOSIS — H2513 Age-related nuclear cataract, bilateral: Secondary | ICD-10-CM | POA: Diagnosis not present

## 2022-10-03 DIAGNOSIS — H40013 Open angle with borderline findings, low risk, bilateral: Secondary | ICD-10-CM | POA: Diagnosis not present

## 2022-10-10 ENCOUNTER — Encounter: Payer: Self-pay | Admitting: Internal Medicine

## 2022-10-25 DIAGNOSIS — Z125 Encounter for screening for malignant neoplasm of prostate: Secondary | ICD-10-CM | POA: Diagnosis not present

## 2022-10-25 DIAGNOSIS — I10 Essential (primary) hypertension: Secondary | ICD-10-CM | POA: Diagnosis not present

## 2022-10-25 DIAGNOSIS — Z1212 Encounter for screening for malignant neoplasm of rectum: Secondary | ICD-10-CM | POA: Diagnosis not present

## 2022-11-01 DIAGNOSIS — R82998 Other abnormal findings in urine: Secondary | ICD-10-CM | POA: Diagnosis not present

## 2022-11-01 DIAGNOSIS — N1831 Chronic kidney disease, stage 3a: Secondary | ICD-10-CM | POA: Diagnosis not present

## 2022-11-01 DIAGNOSIS — I10 Essential (primary) hypertension: Secondary | ICD-10-CM | POA: Diagnosis not present

## 2022-11-01 DIAGNOSIS — Z23 Encounter for immunization: Secondary | ICD-10-CM | POA: Diagnosis not present

## 2022-11-01 DIAGNOSIS — R6889 Other general symptoms and signs: Secondary | ICD-10-CM | POA: Diagnosis not present

## 2022-11-01 DIAGNOSIS — I129 Hypertensive chronic kidney disease with stage 1 through stage 4 chronic kidney disease, or unspecified chronic kidney disease: Secondary | ICD-10-CM | POA: Diagnosis not present

## 2022-11-01 DIAGNOSIS — K219 Gastro-esophageal reflux disease without esophagitis: Secondary | ICD-10-CM | POA: Diagnosis not present

## 2022-11-01 DIAGNOSIS — Z0000 Encounter for general adult medical examination without abnormal findings: Secondary | ICD-10-CM | POA: Diagnosis not present

## 2022-11-13 ENCOUNTER — Ambulatory Visit (AMBULATORY_SURGERY_CENTER): Payer: PPO | Admitting: *Deleted

## 2022-11-13 VITALS — Ht 71.0 in | Wt 184.0 lb

## 2022-11-13 DIAGNOSIS — K317 Polyp of stomach and duodenum: Secondary | ICD-10-CM

## 2022-11-13 DIAGNOSIS — Z86010 Personal history of colonic polyps: Secondary | ICD-10-CM

## 2022-11-13 DIAGNOSIS — G4700 Insomnia, unspecified: Secondary | ICD-10-CM | POA: Insufficient documentation

## 2022-11-13 NOTE — Progress Notes (Signed)
No egg or soy allergy known to patient  No issues known to pt with past sedation with any surgeries or procedures Patient denies ever being told they had issues or difficulty with intubation  No FH of Malignant Hyperthermia Pt is not on diet pills Pt is not on  home 02  Pt is not on blood thinners  Pt denies issues with constipation  Pt is not on dialysis Pt denies any upcoming cardiac testing Patient's chart reviewed by Osvaldo Angst CNRA prior to previsit and patient appropriate for the [facility].  Previsit completed and red dot placed by patient's name on their procedure day (on provider's schedule).  . Weight per PT Visit by phone Instructions reviewed with pt and pt states understanding. Instructed to review again prior to procedure. Pt states they will.  Instructions sent by mail and my chart

## 2022-11-14 ENCOUNTER — Encounter: Payer: Self-pay | Admitting: Internal Medicine

## 2022-11-25 ENCOUNTER — Encounter: Payer: Self-pay | Admitting: Certified Registered Nurse Anesthetist

## 2022-11-27 ENCOUNTER — Encounter: Payer: Self-pay | Admitting: Internal Medicine

## 2022-11-27 ENCOUNTER — Ambulatory Visit (AMBULATORY_SURGERY_CENTER): Payer: PPO | Admitting: Internal Medicine

## 2022-11-27 VITALS — BP 109/67 | HR 60 | Temp 97.3°F | Resp 11 | Ht 71.0 in | Wt 184.0 lb

## 2022-11-27 DIAGNOSIS — Z09 Encounter for follow-up examination after completed treatment for conditions other than malignant neoplasm: Secondary | ICD-10-CM | POA: Diagnosis not present

## 2022-11-27 DIAGNOSIS — K2950 Unspecified chronic gastritis without bleeding: Secondary | ICD-10-CM | POA: Diagnosis not present

## 2022-11-27 DIAGNOSIS — D123 Benign neoplasm of transverse colon: Secondary | ICD-10-CM | POA: Diagnosis not present

## 2022-11-27 DIAGNOSIS — K635 Polyp of colon: Secondary | ICD-10-CM | POA: Diagnosis not present

## 2022-11-27 DIAGNOSIS — I10 Essential (primary) hypertension: Secondary | ICD-10-CM | POA: Diagnosis not present

## 2022-11-27 DIAGNOSIS — N183 Chronic kidney disease, stage 3 unspecified: Secondary | ICD-10-CM | POA: Diagnosis not present

## 2022-11-27 DIAGNOSIS — Z86010 Personal history of colonic polyps: Secondary | ICD-10-CM

## 2022-11-27 DIAGNOSIS — K317 Polyp of stomach and duodenum: Secondary | ICD-10-CM | POA: Diagnosis not present

## 2022-11-27 MED ORDER — SODIUM CHLORIDE 0.9 % IV SOLN: 500.0000 mL | Freq: Once | INTRAVENOUS | Status: DC

## 2022-11-27 NOTE — Op Note (Signed)
[facility] Patient Name: Jamesrobert Brescia Procedure Date: 11/27/2022 8:38 AM MRN: [id number] Endoscopist: Gatha Mayer , MD, [id number] Age: 77 Referring MD:  Date of Birth: [birth year] Gender: Male Account #: [id number] Procedure:                Upper GI endoscopy Indications:              Gastric polyps, Follow-up of gastric polyps Medicines:                Monitored Anesthesia Care Procedure:                Pre-Anesthesia Assessment:                           - Prior to the procedure, a History and Physical                            was performed, and patient medications and                            allergies were reviewed. The patient's tolerance of                            previous anesthesia was also reviewed. The risks                            and benefits of the procedure and the sedation                            options and risks were discussed with the patient.                            All questions were answered, and informed consent                            was obtained. Prior Anticoagulants: The patient has                            taken no anticoagulant or antiplatelet agents. ASA                            Grade Assessment: II - A patient with mild systemic                            disease. After reviewing the risks and benefits,                            the patient was deemed in satisfactory condition to                            undergo the procedure.                           After obtaining informed consent, the endoscope was  passed under direct vision. Throughout the                            procedure, the patient's blood pressure, pulse, and                            oxygen saturations were monitored continuously. The                            GIF [id number] was introduced through the                            mouth, and advanced to the second part of duodenum.                            The upper GI  endoscopy was accomplished without                            difficulty. The patient tolerated the procedure                            well. Findings:                 Multiple diminutive sessile polyps with no stigmata                            of recent bleeding were found in the cardia, in the                            gastric fundus and in the gastric body. Biopsies                            were taken with a cold forceps for histology.                            Verification of patient identification for the                            specimen was done. Estimated blood loss was minimal.                           The cardia and gastric fundus were normal on                            retroflexion.                           The larynx was normal.                           The exam was otherwise without abnormality. Complications:            No immediate complications. Estimated Blood Loss:     Estimated blood loss was minimal. Impression:               -  Multiple gastric polyps. Biopsied.                           - Normal larynx.                           - The examination was otherwise normal. Recommendation:           - Patient has a contact number available for                            emergencies. The signs and symptoms of potential                            delayed complications were discussed with the                            patient. Return to normal activities tomorrow.                            Written discharge instructions were provided to the                            patient.                           - Resume previous diet.                           - Continue present medications.                           - See the other procedure note for documentation of                            additional recommendations.                           - Await pathology results. Gatha Mayer, MD 11/27/2022 9:34:39 AM This report has been signed electronically.

## 2022-11-27 NOTE — Patient Instructions (Addendum)
Stomach polyps seen and biopsied again. Nothing alarming seen.  I found and removed 3 tiny colon polyps. The colonoscopy prep was not good enough to trust things so will need to consider repeating in 1 year. Will discuss.  Prostate is enlarged but nothing suspicious felt.  I will contact you after pathology review.  I appreciate the opportunity to care for you. Gatha Mayer, MD, FACG    YOU HAD AN ENDOSCOPIC PROCEDURE TODAY AT [facility]:   Refer to the procedure report that was given to you for any specific questions about what was found during the examination.  If the procedure report does not answer your questions, please call your gastroenterologist to clarify.  If you requested that your care partner not be given the details of your procedure findings, then the procedure report has been included in a sealed envelope for you to review at your convenience later.  YOU SHOULD EXPECT: Some feelings of bloating in the abdomen. Passage of more gas than usual.  Walking can help get rid of the air that was put into your GI tract during the procedure and reduce the bloating. If you had a lower endoscopy (such as a colonoscopy or flexible sigmoidoscopy) you may notice spotting of blood in your stool or on the toilet paper. If you underwent a bowel prep for your procedure, you may not have a normal bowel movement for a few days.  Please Note:  You might notice some irritation and congestion in your nose or some drainage.  This is from the oxygen used during your procedure.  There is no need for concern and it should clear up in a day or so.  SYMPTOMS TO REPORT IMMEDIATELY:  Following lower endoscopy (colonoscopy or flexible sigmoidoscopy):  Excessive amounts of blood in the stool  Significant tenderness or worsening of abdominal pains  Swelling of the abdomen that is new, acute  Fever of 100F or higher    For urgent or emergent issues, a gastroenterologist can be reached  at any hour by calling [phone]. Do not use MyChart messaging for urgent concerns.    DIET:  We do recommend a small meal at first, but then you may proceed to your regular diet.  Drink plenty of fluids but you should avoid alcoholic beverages for 24 hours.  ACTIVITY:  You should plan to take it easy for the rest of today and you should NOT DRIVE or use heavy machinery until tomorrow (because of the sedation medicines used during the test).    FOLLOW UP: Our staff will call the number listed on your records the next business day following your procedure.  We will call around 7:15- 8:00 am to check on you and address any questions or concerns that you may have regarding the information given to you following your procedure. If we do not reach you, we will leave a message.     If any biopsies were taken you will be contacted by phone or by letter within the next 1-3 weeks.  Please call us at [phone] if you have not heard about the biopsies in 3 weeks.    SIGNATURES/CONFIDENTIALITY: You and/or your care partner have signed paperwork which will be entered into your electronic medical record.  These signatures attest to the fact that that the information above on your After Visit Summary has been reviewed and is understood.  Full responsibility of the confidentiality of this discharge information lies with you and/or your care-partner.

## 2022-11-27 NOTE — Progress Notes (Signed)
Called to room to assist during endoscopic procedure.  Patient ID and intended procedure confirmed with present staff. Received instructions for my participation in the procedure from the performing physician.  

## 2022-11-27 NOTE — Progress Notes (Signed)
Report given to PACU, vss 

## 2022-11-27 NOTE — Progress Notes (Signed)
Pt's states no medical or surgical changes since previsit or office visit. 

## 2022-11-27 NOTE — Progress Notes (Signed)
[facility] History and Physical   Primary Care Physician:  Donnajean Lopes, MD   Reason for Procedure:   Hx gastric and colon polyps  Plan:    EGD, colonoscopy     HPI: Alfred Vega is a 77 y.o. male for surveillance of gastric and colon polyps  2020 fundic gland and hyperplastic polyps in stomach 09/2019 2 adenomas and 1 ssp  Past Medical History:  Diagnosis Date   Chronic kidney disease, stage 3 ([place])    ED (erectile dysfunction)    Gastric polyps - fundic gland and hyperplastic 10/01/2019   GERD (gastroesophageal reflux disease)    Hx of colonic polyps- adenomas, ssp 10/01/2019   Hypertension    Insomnia    Memory loss     Past Surgical History:  Procedure Laterality Date   [place]   bilateral   UPPER GASTROINTESTINAL ENDOSCOPY      Prior to Admission medications   Medication Sig Start Date End Date Taking? Authorizing Provider  Fluorouracil 5 % SOLN  08/02/20  Yes [provider]  NEXIUM 40 MG capsule Take 40 mg by mouth daily before breakfast.  07/10/12  Yes [provider]  sildenafil (VIAGRA) 100 MG tablet TAKE ONE TABLET DAILY AS NEEDED for 6   Yes [provider]  verapamil (CALAN-SR) 120 MG CR tablet  07/10/12  Yes [provider]  aspirin 81 MG tablet Take 81 mg by mouth daily. Patient not taking: Reported on 11/13/2022    [provider]    Current Outpatient Medications  Medication Sig Dispense Refill   Fluorouracil 5 % SOLN      NEXIUM 40 MG capsule Take 40 mg by mouth daily before breakfast.      sildenafil (VIAGRA) 100 MG tablet TAKE ONE TABLET DAILY AS NEEDED for 6     verapamil (CALAN-SR) 120 MG CR tablet      aspirin 81 MG tablet Take 81 mg by mouth daily. (Patient not taking: Reported on 11/13/2022)     Current Facility-Administered Medications  Medication Dose Route Frequency Provider Last Rate Last Admin   0.9 %   sodium chloride infusion  500 mL Intravenous Once Gatha Mayer, MD        Allergies as of 11/27/2022   (No Known Allergies)    Family History  Problem Relation Age of Onset   CAD Mother    Diabetes Mellitus II Mother    Heart disease Father    Heart attack Father    Lymphoma Father    Colon cancer Maternal Grandmother 64   Stomach cancer Neg Hx    Esophageal cancer Neg Hx    Rectal cancer Neg Hx    Colon polyps Neg Hx     Social History   Socioeconomic History   Marital status: Married    Spouse name: Not on file   Number of children: Not on file   Years of education: Not on file   Highest education level: Not on file  Occupational History   Occupation: Self employed    Comment: [employer]  Tobacco Use   Smoking status: Never   Smokeless tobacco: Never  Vaping Use   Vaping Use: Never used  Substance and Sexual Activity   Alcohol use: Yes    Alcohol/week: 1.0 standard drink of alcohol    Types: 1 Cans of beer per week    Comment: 2 beers/week  Drug use: No   Sexual activity: Not on file  Other Topics Concern   Not on file  Social History Narrative   Drinks 1 cup of coffee every other day.   Social Determinants of Health   Financial Resource Strain: Not on file  Food Insecurity: Not on file  Transportation Needs: Not on file  Physical Activity: Not on file  Stress: Not on file  Social Connections: Not on file  Intimate Partner Violence: Not on file    Review of Systems:  All other review of systems negative except as mentioned in the HPI.  Physical Exam: Vital signs BP 133/75   Pulse 73   Temp (!) 97.3 F (36.3 C) (Temporal)   Ht '5\' 11"'$  (1.803 m)   Wt 184 lb (83.5 kg)   SpO2 99%   BMI 25.66 kg/m   General:   Alert,  Well-developed, well-nourished, pleasant and cooperative in NAD Lungs:  Clear throughout to auscultation.   Heart:  Regular rate and rhythm; no murmurs, clicks, rubs,  or gallops. Abdomen:  Soft, nontender and nondistended.  Normal bowel sounds.   Neuro/Psych:  Alert and cooperative. Normal mood and affect. A and O x 3   '@Zaul Hubers'$  Simonne Maffucci, MD, [facility] [phone] (pager) 11/27/2022 8:35 AM@

## 2022-11-27 NOTE — Progress Notes (Signed)
0837 Robinul 0.1 mg IV given due large amount of secretions upon assessment.  MD made aware, vss

## 2022-11-27 NOTE — Op Note (Signed)
[facility] Patient Name: Alfred Vega Procedure Date: 11/27/2022 8:29 AM MRN: [id number] Endoscopist: Gatha Mayer , MD, [id number] Age: 77 Referring MD:  Date of Birth: [birth year] Gender: Male Account #: [id number] Procedure:                Colonoscopy Indications:              Surveillance: Personal history of adenomatous                            polyps on last colonoscopy > 3 years ago, Last                            colonoscopy: December 2020 Medicines:                Monitored Anesthesia Care Procedure:                Pre-Anesthesia Assessment:                           - Prior to the procedure, a History and Physical                            was performed, and patient medications and                            allergies were reviewed. The patient's tolerance of                            previous anesthesia was also reviewed. The risks                            and benefits of the procedure and the sedation                            options and risks were discussed with the patient.                            All questions were answered, and informed consent                            was obtained. Prior Anticoagulants: The patient has                            taken no anticoagulant or antiplatelet agents. ASA                            Grade Assessment: II - A patient with mild systemic                            disease. After reviewing the risks and benefits,                            the patient was deemed in satisfactory condition to  undergo the procedure.                           After obtaining informed consent, the colonoscope                            was passed under direct vision. Throughout the                            procedure, the patient's blood pressure, pulse, and                            oxygen saturations were monitored continuously. The                            CF [id number] was introduced  through the anus                            and advanced to the the cecum, identified by                            appendiceal orifice and ileocecal valve. The                            colonoscopy was somewhat difficult due to poor                            endoscopic visualization. Successful completion of                            the procedure was aided by lavage. The patient                            tolerated the procedure well. The quality of the                            bowel preparation was fair. The ileocecal valve,                            appendiceal orifice, and rectum were photographed.                            The bowel preparation used was Miralax via split                            dose instruction. Scope In: 9:01:53 AM Scope Out: 9:24:53 AM Scope Withdrawal Time: 0 hours 18 minutes 30 seconds  Total Procedure Duration: 0 hours 23 minutes 0 seconds  Findings:                 The digital rectal exam findings include enlarged                            prostate. Pertinent negatives include no palpable  rectal lesions.                           Three sessile polyps were found in the transverse                            colon. The polyps were diminutive in size. These                            polyps were removed with a cold snare. Resection                            and retrieval were complete. Verification of                            patient identification for the specimen was done.                            Estimated blood loss was minimal.                           Multiple diverticula were found in the sigmoid                            colon.                           The exam was otherwise without abnormality on                            direct and retroflexion views. Complications:            No immediate complications. Estimated Blood Loss:     Estimated blood loss was minimal. Impression:               - Preparation of  the colon was fair.                           - Enlarged prostate found on digital rectal exam.                           - Three diminutive polyps in the transverse colon,                            removed with a cold snare. Resected and retrieved.                           - Diverticulosis in the sigmoid colon.                           - The examination was otherwise normal on direct                            and retroflexion views.                           -  Personal history of colonic polyps. 09/2019 2                            adenomas and 1 ssp Recommendation:           - Patient has a contact number available for                            emergencies. The signs and symptoms of potential                            delayed complications were discussed with the                            patient. Return to normal activities tomorrow.                            Written discharge instructions were provided to the                            patient.                           - Resume previous diet.                           - Continue present medications.                           - Await pathology results. need to consider repeat                            exam w/in 1 year given fair prep. Gatha Mayer, MD 11/27/2022 9:39:09 AM This report has been signed electronically.

## 2022-11-28 ENCOUNTER — Telehealth: Payer: Self-pay

## 2022-11-28 NOTE — Telephone Encounter (Signed)
  Follow up Call-     11/27/2022    7:53 AM  Call back number  Post procedure Call Back phone  # [phone]  Permission to leave phone message Yes     Patient questions:  Do you have a fever, pain , or abdominal swelling? No. Pain Score  0 *  Have you tolerated food without any problems? Yes.    Have you been able to return to your normal activities? Yes.    Do you have any questions about your discharge instructions: Diet   No. Medications  No. Follow up visit  No.  Do you have questions or concerns about your Care? No.  Actions: * If pain score is 4 or above: No action needed, pain <4.

## 2022-12-05 DIAGNOSIS — L720 Epidermal cyst: Secondary | ICD-10-CM | POA: Diagnosis not present

## 2022-12-05 DIAGNOSIS — L820 Inflamed seborrheic keratosis: Secondary | ICD-10-CM | POA: Diagnosis not present

## 2022-12-11 DIAGNOSIS — Z23 Encounter for immunization: Secondary | ICD-10-CM | POA: Diagnosis not present

## 2022-12-13 ENCOUNTER — Encounter: Payer: Self-pay | Admitting: Internal Medicine

## 2023-03-11 DIAGNOSIS — R112 Nausea with vomiting, unspecified: Secondary | ICD-10-CM | POA: Diagnosis not present

## 2023-03-11 DIAGNOSIS — Z1152 Encounter for screening for COVID-19: Secondary | ICD-10-CM | POA: Diagnosis not present

## 2023-03-11 DIAGNOSIS — R509 Fever, unspecified: Secondary | ICD-10-CM | POA: Diagnosis not present

## 2023-03-11 DIAGNOSIS — N1831 Chronic kidney disease, stage 3a: Secondary | ICD-10-CM | POA: Diagnosis not present

## 2023-03-11 DIAGNOSIS — J101 Influenza due to other identified influenza virus with other respiratory manifestations: Secondary | ICD-10-CM | POA: Diagnosis not present

## 2023-03-11 DIAGNOSIS — I129 Hypertensive chronic kidney disease with stage 1 through stage 4 chronic kidney disease, or unspecified chronic kidney disease: Secondary | ICD-10-CM | POA: Diagnosis not present

## 2023-03-25 DIAGNOSIS — I10 Essential (primary) hypertension: Secondary | ICD-10-CM | POA: Diagnosis not present

## 2023-03-25 DIAGNOSIS — K219 Gastro-esophageal reflux disease without esophagitis: Secondary | ICD-10-CM | POA: Diagnosis not present

## 2023-03-27 DIAGNOSIS — K219 Gastro-esophageal reflux disease without esophagitis: Secondary | ICD-10-CM | POA: Diagnosis not present

## 2023-03-27 DIAGNOSIS — I10 Essential (primary) hypertension: Secondary | ICD-10-CM | POA: Diagnosis not present

## 2023-06-14 DIAGNOSIS — L720 Epidermal cyst: Secondary | ICD-10-CM | POA: Diagnosis not present

## 2023-06-14 DIAGNOSIS — L258 Unspecified contact dermatitis due to other agents: Secondary | ICD-10-CM | POA: Diagnosis not present

## 2023-06-14 DIAGNOSIS — L218 Other seborrheic dermatitis: Secondary | ICD-10-CM | POA: Diagnosis not present

## 2023-07-09 DIAGNOSIS — Z23 Encounter for immunization: Secondary | ICD-10-CM | POA: Diagnosis not present

## 2023-08-28 DIAGNOSIS — R339 Retention of urine, unspecified: Secondary | ICD-10-CM | POA: Diagnosis not present

## 2023-08-28 DIAGNOSIS — I129 Hypertensive chronic kidney disease with stage 1 through stage 4 chronic kidney disease, or unspecified chronic kidney disease: Secondary | ICD-10-CM | POA: Diagnosis not present

## 2023-08-28 DIAGNOSIS — N390 Urinary tract infection, site not specified: Secondary | ICD-10-CM | POA: Diagnosis not present

## 2023-08-28 DIAGNOSIS — R300 Dysuria: Secondary | ICD-10-CM | POA: Diagnosis not present

## 2023-10-09 DIAGNOSIS — H31092 Other chorioretinal scars, left eye: Secondary | ICD-10-CM | POA: Diagnosis not present

## 2023-10-09 DIAGNOSIS — H2513 Age-related nuclear cataract, bilateral: Secondary | ICD-10-CM | POA: Diagnosis not present

## 2023-10-09 DIAGNOSIS — H0102A Squamous blepharitis right eye, upper and lower eyelids: Secondary | ICD-10-CM | POA: Diagnosis not present

## 2023-10-09 DIAGNOSIS — H0102B Squamous blepharitis left eye, upper and lower eyelids: Secondary | ICD-10-CM | POA: Diagnosis not present

## 2023-10-09 DIAGNOSIS — H10413 Chronic giant papillary conjunctivitis, bilateral: Secondary | ICD-10-CM | POA: Diagnosis not present

## 2023-10-09 DIAGNOSIS — H40013 Open angle with borderline findings, low risk, bilateral: Secondary | ICD-10-CM | POA: Diagnosis not present

## 2023-11-21 DIAGNOSIS — I10 Essential (primary) hypertension: Secondary | ICD-10-CM | POA: Diagnosis not present

## 2023-11-21 DIAGNOSIS — N1831 Chronic kidney disease, stage 3a: Secondary | ICD-10-CM | POA: Diagnosis not present

## 2023-11-21 DIAGNOSIS — N183 Chronic kidney disease, stage 3 unspecified: Secondary | ICD-10-CM | POA: Diagnosis not present

## 2023-11-21 DIAGNOSIS — I129 Hypertensive chronic kidney disease with stage 1 through stage 4 chronic kidney disease, or unspecified chronic kidney disease: Secondary | ICD-10-CM | POA: Diagnosis not present

## 2023-11-21 DIAGNOSIS — Z0189 Encounter for other specified special examinations: Secondary | ICD-10-CM | POA: Diagnosis not present

## 2023-11-21 DIAGNOSIS — Z1212 Encounter for screening for malignant neoplasm of rectum: Secondary | ICD-10-CM | POA: Diagnosis not present

## 2023-11-28 DIAGNOSIS — F988 Other specified behavioral and emotional disorders with onset usually occurring in childhood and adolescence: Secondary | ICD-10-CM | POA: Diagnosis not present

## 2023-11-28 DIAGNOSIS — R82998 Other abnormal findings in urine: Secondary | ICD-10-CM | POA: Diagnosis not present

## 2023-11-28 DIAGNOSIS — I10 Essential (primary) hypertension: Secondary | ICD-10-CM | POA: Diagnosis not present

## 2023-11-28 DIAGNOSIS — R3911 Hesitancy of micturition: Secondary | ICD-10-CM | POA: Diagnosis not present

## 2023-11-28 DIAGNOSIS — Z0000 Encounter for general adult medical examination without abnormal findings: Secondary | ICD-10-CM | POA: Diagnosis not present

## 2023-11-28 DIAGNOSIS — R972 Elevated prostate specific antigen [PSA]: Secondary | ICD-10-CM | POA: Diagnosis not present

## 2023-12-25 DIAGNOSIS — R339 Retention of urine, unspecified: Secondary | ICD-10-CM | POA: Diagnosis not present

## 2023-12-25 DIAGNOSIS — R972 Elevated prostate specific antigen [PSA]: Secondary | ICD-10-CM | POA: Diagnosis not present

## 2024-04-06 DIAGNOSIS — H10413 Chronic giant papillary conjunctivitis, bilateral: Secondary | ICD-10-CM | POA: Diagnosis not present

## 2024-04-06 DIAGNOSIS — H31092 Other chorioretinal scars, left eye: Secondary | ICD-10-CM | POA: Diagnosis not present

## 2024-04-06 DIAGNOSIS — H0102A Squamous blepharitis right eye, upper and lower eyelids: Secondary | ICD-10-CM | POA: Diagnosis not present

## 2024-04-06 DIAGNOSIS — H40013 Open angle with borderline findings, low risk, bilateral: Secondary | ICD-10-CM | POA: Diagnosis not present

## 2024-04-06 DIAGNOSIS — H0102B Squamous blepharitis left eye, upper and lower eyelids: Secondary | ICD-10-CM | POA: Diagnosis not present

## 2024-04-06 DIAGNOSIS — H2513 Age-related nuclear cataract, bilateral: Secondary | ICD-10-CM | POA: Diagnosis not present

## 2024-04-24 DIAGNOSIS — Z8249 Family history of ischemic heart disease and other diseases of the circulatory system: Secondary | ICD-10-CM | POA: Diagnosis not present

## 2024-04-24 DIAGNOSIS — I10 Essential (primary) hypertension: Secondary | ICD-10-CM | POA: Diagnosis not present

## 2024-04-24 DIAGNOSIS — R0789 Other chest pain: Secondary | ICD-10-CM | POA: Diagnosis not present

## 2024-04-24 DIAGNOSIS — Z1330 Encounter for screening examination for mental health and behavioral disorders, unspecified: Secondary | ICD-10-CM | POA: Diagnosis not present

## 2024-05-04 DIAGNOSIS — R0789 Other chest pain: Secondary | ICD-10-CM | POA: Diagnosis not present

## 2024-05-04 DIAGNOSIS — Z8249 Family history of ischemic heart disease and other diseases of the circulatory system: Secondary | ICD-10-CM | POA: Diagnosis not present

## 2024-05-04 DIAGNOSIS — I10 Essential (primary) hypertension: Secondary | ICD-10-CM | POA: Diagnosis not present

## 2024-05-27 DIAGNOSIS — D225 Melanocytic nevi of trunk: Secondary | ICD-10-CM | POA: Diagnosis not present

## 2024-05-27 DIAGNOSIS — L720 Epidermal cyst: Secondary | ICD-10-CM | POA: Diagnosis not present

## 2024-05-27 DIAGNOSIS — L821 Other seborrheic keratosis: Secondary | ICD-10-CM | POA: Diagnosis not present

## 2024-05-27 DIAGNOSIS — L820 Inflamed seborrheic keratosis: Secondary | ICD-10-CM | POA: Diagnosis not present

## 2024-05-27 DIAGNOSIS — Z1283 Encounter for screening for malignant neoplasm of skin: Secondary | ICD-10-CM | POA: Diagnosis not present

## 2024-07-23 DIAGNOSIS — Z23 Encounter for immunization: Secondary | ICD-10-CM | POA: Diagnosis not present

## 2024-07-31 DIAGNOSIS — L821 Other seborrheic keratosis: Secondary | ICD-10-CM | POA: Diagnosis not present

## 2024-08-24 DIAGNOSIS — Z8249 Family history of ischemic heart disease and other diseases of the circulatory system: Secondary | ICD-10-CM | POA: Diagnosis not present

## 2024-08-24 DIAGNOSIS — R0789 Other chest pain: Secondary | ICD-10-CM | POA: Diagnosis not present

## 2024-08-24 DIAGNOSIS — I10 Essential (primary) hypertension: Secondary | ICD-10-CM | POA: Diagnosis not present
